# Patient Record
Sex: Male | Born: 1985 | Race: White | Hispanic: No | Marital: Single | Smoking: Current every day smoker
Health system: Southern US, Community
[De-identification: ages and names within clinical notes are randomized; demographics above are authoritative.]

## PROBLEM LIST (undated history)

## (undated) DIAGNOSIS — S069XAA Unspecified intracranial injury with loss of consciousness status unknown, initial encounter: Secondary | ICD-10-CM

## (undated) DIAGNOSIS — F988 Other specified behavioral and emotional disorders with onset usually occurring in childhood and adolescence: Secondary | ICD-10-CM

## (undated) DIAGNOSIS — S069X9A Unspecified intracranial injury with loss of consciousness of unspecified duration, initial encounter: Secondary | ICD-10-CM

## (undated) HISTORY — PX: APPENDECTOMY: SHX54

## (undated) HISTORY — PX: SKIN GRAFT: SHX250

---
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
## 2008-07-09 ENCOUNTER — Emergency Department: Payer: Self-pay | Admitting: Internal Medicine

## 2009-09-14 ENCOUNTER — Inpatient Hospital Stay: Payer: Self-pay | Admitting: Surgery

## 2009-09-14 ENCOUNTER — Ambulatory Visit: Payer: Self-pay | Admitting: Cardiothoracic Surgery

## 2009-10-06 ENCOUNTER — Inpatient Hospital Stay: Payer: Self-pay | Admitting: Surgery

## 2010-12-01 ENCOUNTER — Emergency Department (HOSPITAL_COMMUNITY)
Admission: EM | Admit: 2010-12-01 | Discharge: 2010-12-02 | Disposition: A | Discharge status: Home / Self Care | Payer: BC Managed Care – PPO | Attending: Emergency Medicine | Admitting: Emergency Medicine

## 2010-12-01 DIAGNOSIS — J3489 Other specified disorders of nose and nasal sinuses: Secondary | ICD-10-CM | POA: Insufficient documentation

## 2010-12-01 DIAGNOSIS — F411 Generalized anxiety disorder: Secondary | ICD-10-CM | POA: Insufficient documentation

## 2010-12-01 DIAGNOSIS — IMO0001 Reserved for inherently not codable concepts without codable children: Secondary | ICD-10-CM | POA: Insufficient documentation

## 2010-12-01 DIAGNOSIS — F17200 Nicotine dependence, unspecified, uncomplicated: Secondary | ICD-10-CM | POA: Insufficient documentation

## 2010-12-01 DIAGNOSIS — R059 Cough, unspecified: Secondary | ICD-10-CM | POA: Insufficient documentation

## 2010-12-01 DIAGNOSIS — J069 Acute upper respiratory infection, unspecified: Secondary | ICD-10-CM | POA: Insufficient documentation

## 2010-12-01 DIAGNOSIS — R05 Cough: Secondary | ICD-10-CM | POA: Insufficient documentation

## 2010-12-01 DIAGNOSIS — R062 Wheezing: Secondary | ICD-10-CM | POA: Insufficient documentation

## 2010-12-02 ENCOUNTER — Emergency Department (HOSPITAL_COMMUNITY): Payer: BC Managed Care – PPO

## 2011-01-23 IMAGING — CR DG CHEST 1V PORT
1 series · 1 of 1 positions shown · non-contrast
Comparison: none

REASON FOR EXAM: left pneumothorax
COMMENTS:

PROCEDURE:     DXR - DXR PORTABLE CHEST SINGLE VIEW  - October 09, 2009  [DATE]
RESULT:     Comparison: 10/07/2009 , 10/08/2009

[view not recorded]
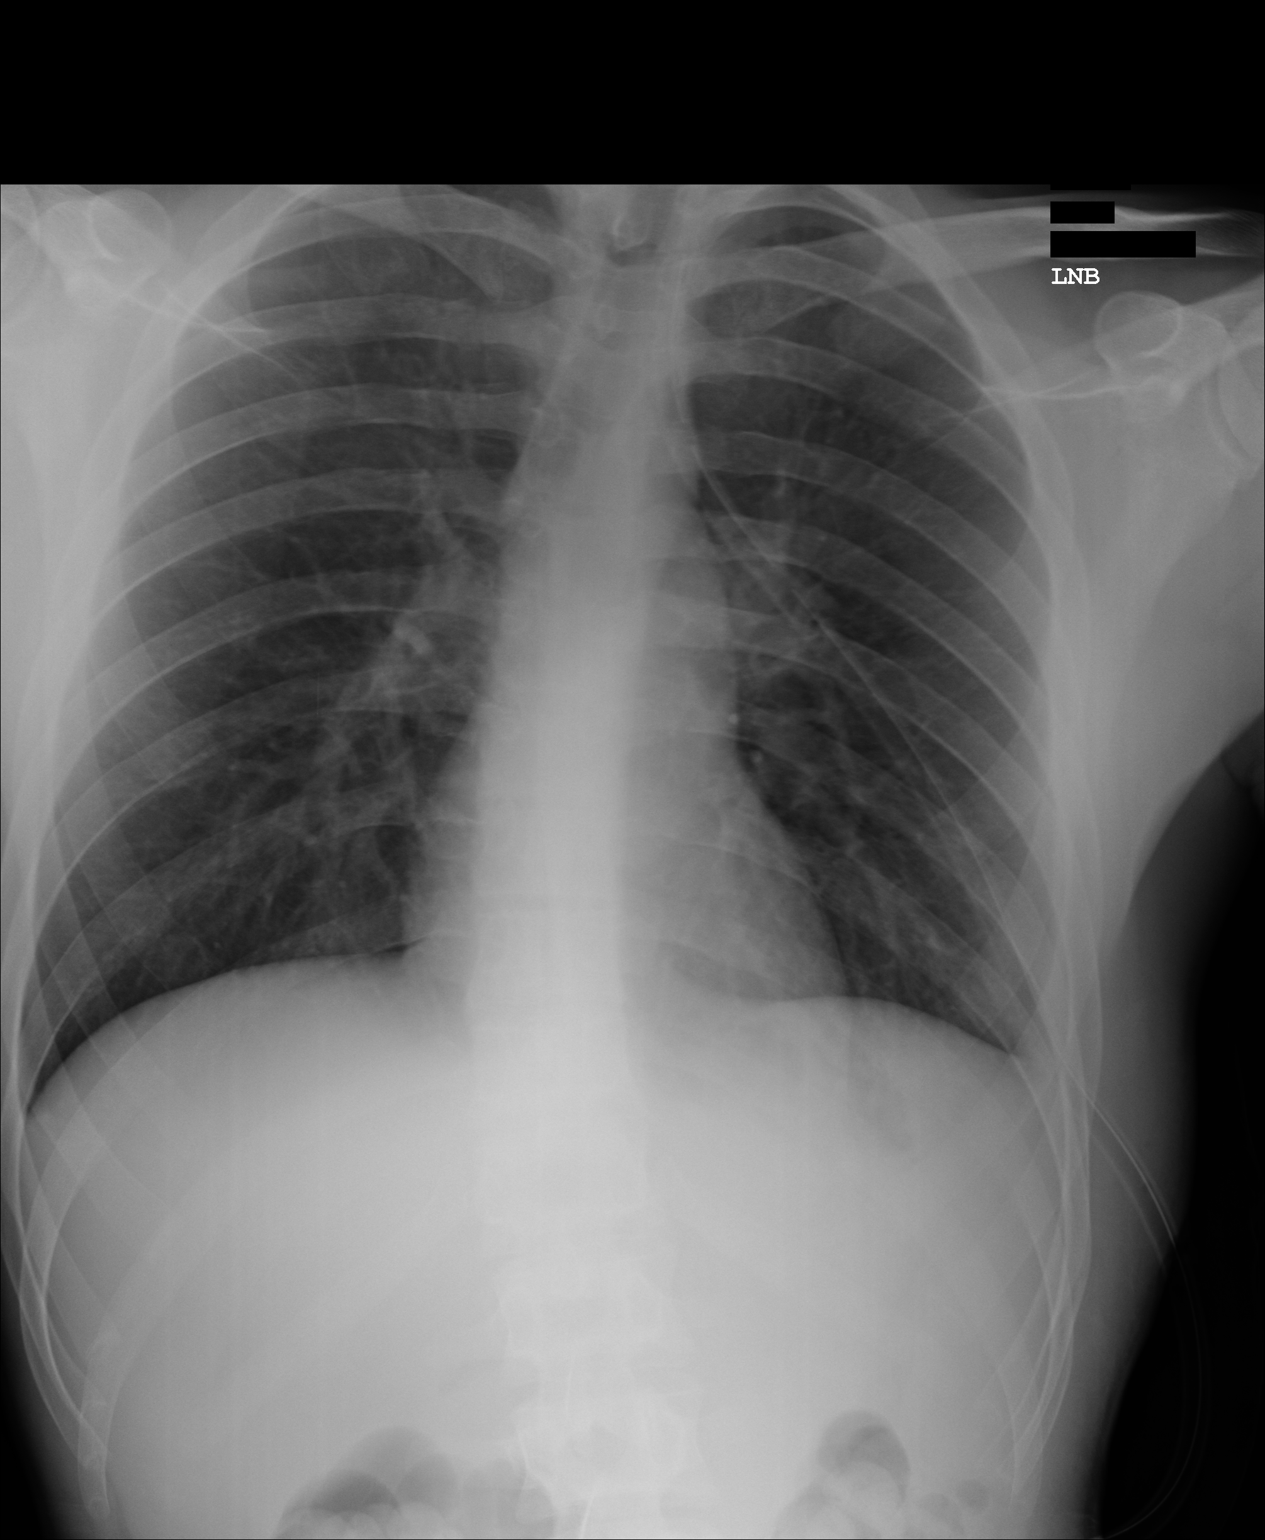

[1 of 1 positions shown; findings below may reference images not displayed]

FINDINGS: Single portable AP chest radiograph is provided. There is a left-sided chest
tube in unchanged position. There is no left pneumothorax. There is no focal
consolidation or pleural effusion. Normal cardiomediastinal silhouette. The
osseous structures are unremarkable.
IMPRESSION: Left-sided chest tube without evidence of a pneumothorax.

## 2011-04-27 ENCOUNTER — Emergency Department: Payer: Self-pay | Admitting: Emergency Medicine

## 2011-05-03 ENCOUNTER — Emergency Department: Payer: Self-pay | Admitting: Emergency Medicine

## 2011-10-09 ENCOUNTER — Emergency Department: Payer: Self-pay | Admitting: Emergency Medicine

## 2011-10-09 LAB — CBC
HCT: 41.9 % (ref 40.0–52.0)
HGB: 14.6 g/dL (ref 13.0–18.0)
MCH: 28.9 pg (ref 26.0–34.0)
MCHC: 34.9 g/dL (ref 32.0–36.0)
MCV: 83 fL (ref 80–100)
Platelet: 190 10*3/uL (ref 150–440)
RDW: 16 % — ABNORMAL HIGH (ref 11.5–14.5)

## 2011-10-09 LAB — COMPREHENSIVE METABOLIC PANEL
Alkaline Phosphatase: 127 U/L (ref 50–136)
Anion Gap: 9 (ref 7–16)
Bilirubin,Total: 0.4 mg/dL (ref 0.2–1.0)
Calcium, Total: 9.3 mg/dL (ref 8.5–10.1)
Co2: 24 mmol/L (ref 21–32)
EGFR (African American): >60
EGFR (Non-African Amer.): >60
Osmolality: 281 (ref 275–301)
SGPT (ALT): 27 U/L (ref 12–78)
Sodium: 141 mmol/L (ref 136–145)

## 2011-10-09 LAB — URINALYSIS, COMPLETE
Bacteria: NONE SEEN
Bilirubin,UR: NEGATIVE
Blood: NEGATIVE
Glucose,UR: NEGATIVE mg/dL (ref 0–75)
Leukocyte Esterase: NEGATIVE
RBC,UR: <1 /HPF (ref 0–5)
Squamous Epithelial: NONE SEEN
WBC UR: NONE SEEN /HPF (ref 0–5)

## 2011-10-09 LAB — DRUG SCREEN, URINE
Amphetamines, Ur Screen: NEGATIVE (ref ?–1000)
Barbiturates, Ur Screen: NEGATIVE (ref ?–200)
Cocaine Metabolite,Ur ~~LOC~~: NEGATIVE (ref ?–300)
Opiate, Ur Screen: NEGATIVE (ref ?–300)
Tricyclic, Ur Screen: NEGATIVE (ref ?–1000)

## 2011-10-09 LAB — SALICYLATE LEVEL: Salicylates, Serum: <1.7 mg/dL

## 2011-10-09 LAB — ETHANOL: Ethanol: 176 mg/dL

## 2013-03-14 ENCOUNTER — Ambulatory Visit: Payer: Self-pay | Admitting: Otolaryngology

## 2013-10-08 ENCOUNTER — Emergency Department: Payer: Self-pay | Admitting: Emergency Medicine

## 2013-10-09 LAB — COMPREHENSIVE METABOLIC PANEL
Albumin: 3.8 g/dL (ref 3.4–5.0)
Alkaline Phosphatase: 108 U/L
Anion Gap: 8 (ref 7–16)
BILIRUBIN TOTAL: 0.2 mg/dL (ref 0.2–1.0)
BUN: 10 mg/dL (ref 7–18)
CALCIUM: 8.8 mg/dL (ref 8.5–10.1)
CREATININE: 0.95 mg/dL (ref 0.60–1.30)
Chloride: 107 mmol/L (ref 98–107)
Co2: 26 mmol/L (ref 21–32)
EGFR (Non-African Amer.): >60
GLUCOSE: 92 mg/dL (ref 65–99)
OSMOLALITY: 280 (ref 275–301)
Potassium: 3.7 mmol/L (ref 3.5–5.1)
SGOT(AST): 26 U/L (ref 15–37)
SGPT (ALT): 32 U/L
Sodium: 141 mmol/L (ref 136–145)
TOTAL PROTEIN: 7.3 g/dL (ref 6.4–8.2)

## 2013-10-09 LAB — CBC
HCT: 43.8 % (ref 40.0–52.0)
HGB: 14.6 g/dL (ref 13.0–18.0)
MCH: 29.6 pg (ref 26.0–34.0)
MCHC: 33.3 g/dL (ref 32.0–36.0)
MCV: 89 fL (ref 80–100)
PLATELETS: 131 10*3/uL — AB (ref 150–440)
RBC: 4.92 10*6/uL (ref 4.40–5.90)
RDW: 13.6 % (ref 11.5–14.5)
WBC: 5.1 10*3/uL (ref 3.8–10.6)

## 2013-10-09 LAB — URINALYSIS, COMPLETE
BILIRUBIN, UR: NEGATIVE
GLUCOSE, UR: NEGATIVE mg/dL (ref 0–75)
Ketone: NEGATIVE
LEUKOCYTE ESTERASE: NEGATIVE
Nitrite: NEGATIVE
PROTEIN: NEGATIVE
Ph: 6 (ref 4.5–8.0)
Specific Gravity: 1.014 (ref 1.003–1.030)

## 2013-10-09 LAB — GC/CHLAMYDIA PROBE AMP

## 2015-10-27 ENCOUNTER — Encounter: Payer: Self-pay | Admitting: Emergency Medicine

## 2015-10-27 ENCOUNTER — Emergency Department
Admission: EM | Admit: 2015-10-27 | Discharge: 2015-10-28 | Disposition: A | Discharge status: Home / Self Care | Payer: Self-pay | Attending: Emergency Medicine | Admitting: Emergency Medicine

## 2015-10-27 DIAGNOSIS — R1084 Generalized abdominal pain: Secondary | ICD-10-CM

## 2015-10-27 DIAGNOSIS — K219 Gastro-esophageal reflux disease without esophagitis: Secondary | ICD-10-CM

## 2015-10-27 DIAGNOSIS — Z0000 Encounter for general adult medical examination without abnormal findings: Secondary | ICD-10-CM | POA: Insufficient documentation

## 2015-10-27 DIAGNOSIS — Z87891 Personal history of nicotine dependence: Secondary | ICD-10-CM | POA: Insufficient documentation

## 2015-10-27 LAB — URINALYSIS COMPLETE WITH MICROSCOPIC (ARMC ONLY)
BACTERIA UA: NONE SEEN
Bilirubin Urine: NEGATIVE
Glucose, UA: NEGATIVE mg/dL
HGB URINE DIPSTICK: NEGATIVE
Ketones, ur: NEGATIVE mg/dL
LEUKOCYTES UA: NEGATIVE
Nitrite: NEGATIVE
PH: 5 (ref 5.0–8.0)
Protein, ur: NEGATIVE mg/dL
SQUAMOUS EPITHELIAL / LPF: NONE SEEN
Specific Gravity, Urine: 1.018 (ref 1.005–1.030)

## 2015-10-27 LAB — CBC
HEMATOCRIT: 45.3 % (ref 40.0–52.0)
Hemoglobin: 15.6 g/dL (ref 13.0–18.0)
MCH: 29.9 pg (ref 26.0–34.0)
MCHC: 34.3 g/dL (ref 32.0–36.0)
MCV: 87.3 fL (ref 80.0–100.0)
Platelets: 205 10*3/uL (ref 150–440)
RBC: 5.19 MIL/uL (ref 4.40–5.90)
RDW: 13.6 % (ref 11.5–14.5)
WBC: 6.8 10*3/uL (ref 3.8–10.6)

## 2015-10-27 LAB — COMPREHENSIVE METABOLIC PANEL
ALT: 25 U/L (ref 17–63)
AST: 24 U/L (ref 15–41)
Albumin: 4.9 g/dL (ref 3.5–5.0)
Alkaline Phosphatase: 94 U/L (ref 38–126)
Anion gap: 8 (ref 5–15)
BUN: 13 mg/dL (ref 6–20)
CHLORIDE: 105 mmol/L (ref 101–111)
CO2: 28 mmol/L (ref 22–32)
Calcium: 9.4 mg/dL (ref 8.9–10.3)
Creatinine, Ser: 1 mg/dL (ref 0.61–1.24)
Glucose, Bld: 87 mg/dL (ref 65–99)
POTASSIUM: 3.8 mmol/L (ref 3.5–5.1)
Sodium: 141 mmol/L (ref 135–145)
Total Bilirubin: 0.5 mg/dL (ref 0.3–1.2)
Total Protein: 8 g/dL (ref 6.5–8.1)

## 2015-10-27 LAB — LIPASE, BLOOD: LIPASE: 32 U/L (ref 11–51)

## 2015-10-27 NOTE — ED Notes (Signed)
Pt reports burning with urination x1 week, right abdominal pain x3 weeks.

## 2015-10-27 NOTE — ED Triage Notes (Signed)
Patient ambulatory to triage with steady gait, without difficulty or distress noted; pt reports since last night having lower abd pain with no accomp symptoms

## 2015-10-28 LAB — CHLAMYDIA/NGC RT PCR (ARMC ONLY)
CHLAMYDIA TR: NOT DETECTED
N gonorrhoeae: NOT DETECTED

## 2015-10-28 MED ORDER — FAMOTIDINE 20 MG PO TABS: 20.0000 mg | ORAL_TABLET | Freq: Two times a day (BID) | ORAL | 1 refills | Status: DC

## 2015-10-28 NOTE — ED Provider Notes (Signed)
[facility] Emergency Department Provider Note        Time seen: ----------------------------------------- 12:12 AM on 10/28/2015 -----------------------------------------    I have reviewed the triage vital signs and the nursing notes.   HISTORY  Chief Complaint Abdominal Pain    HPI Anthony Snow is a 30 y.o. male who presents to ER with reports of lower abdominal pain with no other associated symptoms. Patient states he was making out with a girl and she was having the same symptoms that he had. There is nothing the same restaurant, he was also concerned may have an STD. He denies any dysuria or discharge from his penis. Patient described a funny feeling in his mouth was concerned he may have herpes.   History reviewed. No pertinent past medical history.  There are no active problems to display for this patient.   Past Surgical History:  Procedure Laterality Date  . APPENDECTOMY      Allergies Review of patient's allergies indicates no known allergies.  Social History Social History  Substance Use Topics  . Smoking status: Former Games developer  . Smokeless tobacco: Never Used  . Alcohol use No    Review of Systems Constitutional: Negative for fever. Cardiovascular: Negative for chest pain. Respiratory: Negative for shortness of breath. Gastrointestinal: Positive for abdominal pain Genitourinary: Negative for dysuria. Musculoskeletal: Negative for back pain. Skin: Negative for rash. Neurological: Negative for headaches, focal weakness or numbness.  10-point ROS otherwise negative.  ____________________________________________   PHYSICAL EXAM:  VITAL SIGNS: ED Triage Vitals  Enc Vitals Group     BP 10/27/15 2056 133/69     Pulse Rate 10/27/15 2056 75     Resp 10/27/15 2056 18     Temp 10/27/15 2056 97.5 F (36.4 C)     Temp Source 10/27/15 2056 Oral     SpO2 10/27/15 2056 100 %     Weight 10/27/15 2057 140 lb (63.5 kg)      Height 10/27/15 2057 6\' 3"  (1.905 m)     Head Circumference --      Peak Flow --      Pain Score 10/27/15 2055 3     Pain Loc --      Pain Edu? --      Excl. in GC? --     Constitutional: Alert and oriented. Well appearing and in no distress.Anxious Eyes: Conjunctivae are normal. PERRL. Normal extraocular movements. ENT   Head: Normocephalic and atraumatic.   Nose: No congestion/rhinnorhea.   Mouth/Throat: Mucous membranes are moist.No intraoral lesions are noted   Neck: No stridor. Cardiovascular: Normal rate, regular rhythm. No murmurs, rubs, or gallops. Respiratory: Normal respiratory effort without tachypnea nor retractions. Breath sounds are clear and equal bilaterally. No wheezes/rales/rhonchi. Gastrointestinal: Soft and nontender. Normal bowel sounds Musculoskeletal: Nontender with normal range of motion in all extremities. No lower extremity tenderness nor edema. Neurologic:  Normal speech and language. No gross focal neurologic deficits are appreciated.  Skin:  Skin is warm, dry and intact. No rash noted. Psychiatric: Mood and affect are normal. Speech and behavior are normal.  ____________________________________________  ED COURSE:  Pertinent labs & imaging results that were available during my care of the patient were reviewed by me and considered in my medical decision making (see chart for details). Clinical Course  Patient is in no distress, we will assess with basic labs and imaging if necessary  Procedures ____________________________________________   LABS (pertinent positives/negatives)  Labs Reviewed  URINALYSIS COMPLETEWITH MICROSCOPIC ([facility] ONLY) -  Abnormal; Notable for the following:       Result Value   Color, Urine YELLOW (*)    APPearance CLEAR (*)    All other components within normal limits  CHLAMYDIA/NGC RT PCR ([facility] ONLY)  LIPASE, BLOOD  COMPREHENSIVE METABOLIC PANEL  CBC    ____________________________________________  FINAL ASSESSMENT AND PLAN  Abdominal pain, medical screening exam  Plan: Patient with labs as dictated above. Patient is in no distress, I have advised him we will add a GC and chlamydia test to his urine and contact him if it is positive. He does describe reflux symptoms from time to time, I will start him on Pepcid. He is stable for outpatient follow-up.   Emily FilbertWilliams, Chrystie Hagwood E, MD   Note: This dictation was prepared with Dragon dictation. Any transcriptional errors that result from this process are unintentional    Emily FilbertJonathan E Victoria Euceda, MD 10/28/15 [phone]

## 2015-10-28 NOTE — ED Notes (Signed)
Called lab to let know about add on urine order.

## 2017-05-26 ENCOUNTER — Emergency Department: Payer: Self-pay

## 2017-05-26 ENCOUNTER — Emergency Department
Admission: EM | Admit: 2017-05-26 | Discharge: 2017-05-27 | Disposition: A | Discharge status: Home / Self Care | Payer: Self-pay | Attending: Student in an Organized Health Care Education/Training Program | Admitting: Student in an Organized Health Care Education/Training Program

## 2017-05-26 DIAGNOSIS — R079 Chest pain, unspecified: Secondary | ICD-10-CM

## 2017-05-26 DIAGNOSIS — Z79899 Other long term (current) drug therapy: Secondary | ICD-10-CM | POA: Insufficient documentation

## 2017-05-26 DIAGNOSIS — F439 Reaction to severe stress, unspecified: Secondary | ICD-10-CM | POA: Insufficient documentation

## 2017-05-26 DIAGNOSIS — Z87891 Personal history of nicotine dependence: Secondary | ICD-10-CM | POA: Insufficient documentation

## 2017-05-26 HISTORY — DX: Unspecified intracranial injury with loss of consciousness status unknown, initial encounter: S06.9XAA

## 2017-05-26 HISTORY — DX: Unspecified intracranial injury with loss of consciousness of unspecified duration, initial encounter: S06.9X9A

## 2017-05-26 LAB — CBC
HEMATOCRIT: 48.2 % (ref 40.0–52.0)
HEMOGLOBIN: 17 g/dL (ref 13.0–18.0)
MCH: 30.3 pg (ref 26.0–34.0)
MCHC: 35.3 g/dL (ref 32.0–36.0)
MCV: 85.9 fL (ref 80.0–100.0)
Platelets: 260 10*3/uL (ref 150–440)
RBC: 5.61 MIL/uL (ref 4.40–5.90)
RDW: 13.6 % (ref 11.5–14.5)
WBC: 9.2 10*3/uL (ref 3.8–10.6)

## 2017-05-26 LAB — BASIC METABOLIC PANEL
Anion gap: 8 (ref 5–15)
BUN: 12 mg/dL (ref 6–20)
CHLORIDE: 103 mmol/L (ref 101–111)
CO2: 29 mmol/L (ref 22–32)
Calcium: 9.4 mg/dL (ref 8.9–10.3)
Creatinine, Ser: 0.88 mg/dL (ref 0.61–1.24)
GFR calc Af Amer: >60 mL/min (ref >60)
GFR calc non Af Amer: >60 mL/min (ref >60)
Glucose, Bld: 95 mg/dL (ref 65–99)
POTASSIUM: 3.7 mmol/L (ref 3.5–5.1)
SODIUM: 140 mmol/L (ref 135–145)

## 2017-05-26 LAB — LIPASE, BLOOD: Lipase: 32 U/L (ref 11–51)

## 2017-05-26 LAB — TROPONIN I: Troponin I: <0.03 ng/mL (ref <0.03)

## 2017-05-26 MED ORDER — LORAZEPAM 1 MG PO TABS
1.0000 mg | ORAL_TABLET | Freq: Once | ORAL | Status: AC
  Administered 2017-05-26: 1 mg via ORAL
  Filled 2017-05-26: qty 1

## 2017-05-26 NOTE — ED Triage Notes (Signed)
Pt reports that he has been having intermittent chest and stomach pain for 2 weeks.  Pt reports that he had this in [place] and went to the hospital, but had a panic attack and left.  Pt denies drug use.

## 2017-05-26 NOTE — ED Provider Notes (Signed)
[facility] Emergency Department Provider Note    First MD Initiated Contact with Patient 05/26/17 2304     (approximate)  I have reviewed the triage vital signs and the nursing notes.   HISTORY  Chief Complaint Chest Pain and Abdominal Pain    HPI Su MonksRobert J Chea is a 32 y.o. male presents with 2 weeks of intermittent chest pain and stomach pain associated with shortness of breath.  States the pain is frequently worsened by stress and anxiety.  Patient states he has been stressed out not being able to see his kids and with recent travel as well as stresses at work.  Denies any fevers.  No cough.  States that the epigastric discomfort is gnawing and aching.  Denies any pain or nausea at this time.  States he has had decreased oral intake.  Past Medical History:  Diagnosis Date  . 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 Past Surgical History:  Procedure Laterality Date  . APPENDECTOMY     There are no active problems to display for this patient.     Prior to Admission medications   Medication Sig Start Date End Date Taking? Authorizing Provider  famotidine (PEPCID) 20 MG tablet Take 1 tablet (20 mg total) by mouth 2 (two) times daily. 10/28/15   Emily FilbertWilliams, Jonathan E, MD    Allergies Patient has no known allergies.    Social History Social History   Tobacco Use  . Smoking status: Former Games developermoker  . Smokeless tobacco: Never Used  Substance Use Topics  . Alcohol use: No  . Drug use: Yes    Types: Marijuana    Review of Systems Patient denies headaches, rhinorrhea, blurry vision, numbness, shortness of breath, chest pain, edema, cough, abdominal pain, nausea, vomiting, diarrhea, dysuria, fevers, rashes or hallucinations unless otherwise stated above in HPI. ____________________________________________   PHYSICAL EXAM:  VITAL SIGNS: Vitals:   05/26/17 2249  Temp: 98.1 F (36.7 C)    Constitutional: Alert and  oriented. anxious appearing and in no acute distress. Eyes: Conjunctivae are normal.  Head: Atraumatic. Nose: No congestion/rhinnorhea. Mouth/Throat: Mucous membranes are moist.   Neck: No stridor. Painless ROM.  Cardiovascular: Normal rate, regular rhythm. Grossly normal heart sounds.  Good peripheral circulation. Respiratory: Normal respiratory effort.  No retractions. Lungs CTAB. Gastrointestinal: Soft and nontender. No distention. No abdominal bruits. No CVA tenderness. Genitourinary:  Musculoskeletal: No lower extremity tenderness nor edema.  No joint effusions. Neurologic:  Normal speech and language. No gross focal neurologic deficits are appreciated. No facial droop Skin:  Skin is warm, dry and intact. No rash noted. Psychiatric: very anxious appearing, No SI or HI ____________________________________________   LABS (all labs ordered are listed, but only abnormal results are displayed)  Results for orders placed or performed during the hospital encounter of 05/26/17 (from the past 24 hour(s))  Basic metabolic panel     Status: None   Collection Time: 05/26/17 10:52 PM  Result Value Ref Range   Sodium 140 135 - 145 mmol/L   Potassium 3.7 3.5 - 5.1 mmol/L   Chloride 103 101 - 111 mmol/L   CO2 29 22 - 32 mmol/L   Glucose, Bld 95 65 - 99 mg/dL   BUN 12 6 - 20 mg/dL   Creatinine, Ser 1.610.88 0.61 - 1.24 mg/dL   Calcium 9.4 8.9 - 09.610.3 mg/dL   GFR calc non Af Amer >60 >60 mL/min   GFR calc Af Amer >60 >60  mL/min   Anion gap 8 5 - 15  CBC     Status: None   Collection Time: 05/26/17 10:52 PM  Result Value Ref Range   WBC 9.2 3.8 - 10.6 K/uL   RBC 5.61 4.40 - 5.90 MIL/uL   Hemoglobin 17.0 13.0 - 18.0 g/dL   HCT 40.9 81.1 - 91.4 %   MCV 85.9 80.0 - 100.0 fL   MCH 30.3 26.0 - 34.0 pg   MCHC 35.3 32.0 - 36.0 g/dL   RDW 78.2 95.6 - 21.3 %   Platelets 260 150 - 440 K/uL  Troponin I     Status: None   Collection Time: 05/26/17 10:52 PM  Result Value Ref Range   Troponin I <0.03  <0.03 ng/mL  Lipase, blood     Status: None   Collection Time: 05/26/17 10:52 PM  Result Value Ref Range   Lipase 32 11 - 51 U/L   ____________________________________________  EKG My review and personal interpretation at Time: 22:56   Indication: chest pain  Rate: 99  Rhythm: sinus Axis: normal Other: normal intervals, no stemi, no brugada ____________________________________________  RADIOLOGY  I personally reviewed all radiographic images ordered to evaluate for the above acute complaints and reviewed radiology reports and findings.  These findings were personally discussed with the patient.  Please see medical record for radiology report.  ____________________________________________   PROCEDURES  Procedure(s) performed:  Procedures    Critical Care performed: no ____________________________________________   INITIAL IMPRESSION / ASSESSMENT AND PLAN / ED COURSE  Pertinent labs & imaging results that were available during my care of the patient were reviewed by me and considered in my medical decision making (see chart for details).  DDX: Anxiety, gastritis, ACS, dissection, PE, pneumonia  KLAYTON MONIE is a 32 y.o. who presents to the ED with symptoms as described above.  Patient well-appearing but is very anxious.  No respiratory distress.  Vital signs are stable.  Low risk heart score of 2.  Is not clinically consistent with ACS.  Not clinically consistent with dissection.  He is low risk by Wells criteria and is PERC negative.  Most clinically consistent with anxiety and stress.  Will give ativan and reassess.    Reassessed with significant improvement in symptoms.  Do suspect there is large component of stress therefore will give a small prescription of Valium to be used as needed on the patient is arranging follow-up with PCP and [facility].  Have discussed with the patient and available family all diagnostics and treatments performed thus far and all questions were  answered to the best of my ability. The patient demonstrates understanding and agreement with plan.   As part of my medical decision making, I reviewed the following data within the electronic MEDICAL RECORD NUMBER Nursing notes reviewed and incorporated, Labs reviewed, notes from prior ED visits and [place] Controlled Substance Database   ____________________________________________   FINAL CLINICAL IMPRESSION(S) / ED DIAGNOSES  Final diagnoses:  Chest pain, unspecified type  Stress      NEW MEDICATIONS STARTED DURING THIS VISIT:  New Prescriptions   No medications on file     Note:  This document was prepared using Dragon voice recognition software and may include unintentional dictation errors.    Willy Eddy, MD 05/27/17 [phone]

## 2017-05-26 NOTE — ED Notes (Signed)
ED Provider at bedside. 

## 2017-05-27 MED ORDER — DIAZEPAM 5 MG PO TABS: 5.0000 mg | ORAL_TABLET | Freq: Two times a day (BID) | ORAL | 0 refills | Status: AC | PRN

## 2017-05-27 MED ORDER — LORAZEPAM 1 MG PO TABS
1.0000 mg | ORAL_TABLET | Freq: Once | ORAL | Status: AC
  Administered 2017-05-27: 1 mg via ORAL

## 2017-05-27 MED ORDER — LORAZEPAM 1 MG PO TABS
ORAL_TABLET | ORAL | Status: AC
  Filled 2017-05-27: qty 1

## 2017-05-27 NOTE — ED Notes (Signed)
Reviewed discharge instructions, follow-up care, and prescriptions with patient. Patient verbalized understanding of all information reviewed. Patient stable, with no distress noted at this time.    

## 2017-05-27 NOTE — ED Notes (Signed)
ED Provider at bedside. 

## 2017-11-21 ENCOUNTER — Emergency Department: Payer: Medicaid Other

## 2017-11-21 ENCOUNTER — Encounter: Payer: Self-pay | Admitting: Emergency Medicine

## 2017-11-21 ENCOUNTER — Emergency Department
Admission: EM | Admit: 2017-11-21 | Discharge: 2017-11-21 | Disposition: A | Discharge status: Home / Self Care | Payer: Medicaid Other | Attending: Emergency Medicine | Admitting: Emergency Medicine

## 2017-11-21 DIAGNOSIS — T07XXXA Unspecified multiple injuries, initial encounter: Secondary | ICD-10-CM

## 2017-11-21 DIAGNOSIS — Y939 Activity, unspecified: Secondary | ICD-10-CM | POA: Insufficient documentation

## 2017-11-21 DIAGNOSIS — F10920 Alcohol use, unspecified with intoxication, uncomplicated: Secondary | ICD-10-CM

## 2017-11-21 DIAGNOSIS — F10129 Alcohol abuse with intoxication, unspecified: Secondary | ICD-10-CM | POA: Insufficient documentation

## 2017-11-21 DIAGNOSIS — T148XXA Other injury of unspecified body region, initial encounter: Secondary | ICD-10-CM | POA: Insufficient documentation

## 2017-11-21 DIAGNOSIS — Y9289 Other specified places as the place of occurrence of the external cause: Secondary | ICD-10-CM | POA: Insufficient documentation

## 2017-11-21 DIAGNOSIS — T7611XA Adult physical abuse, suspected, initial encounter: Secondary | ICD-10-CM | POA: Insufficient documentation

## 2017-11-21 DIAGNOSIS — Y999 Unspecified external cause status: Secondary | ICD-10-CM | POA: Insufficient documentation

## 2017-11-21 DIAGNOSIS — Z79899 Other long term (current) drug therapy: Secondary | ICD-10-CM | POA: Insufficient documentation

## 2017-11-21 DIAGNOSIS — Z87891 Personal history of nicotine dependence: Secondary | ICD-10-CM | POA: Insufficient documentation

## 2017-11-21 LAB — URINE DRUG SCREEN, QUALITATIVE (ARMC ONLY)
Amphetamines, Ur Screen: NOT DETECTED
BARBITURATES, UR SCREEN: NOT DETECTED
BENZODIAZEPINE, UR SCRN: NOT DETECTED
CANNABINOID 50 NG, UR ~~LOC~~: NOT DETECTED
Cocaine Metabolite,Ur ~~LOC~~: NOT DETECTED
MDMA (Ecstasy)Ur Screen: NOT DETECTED
METHADONE SCREEN, URINE: NOT DETECTED
OPIATE, UR SCREEN: NOT DETECTED
PHENCYCLIDINE (PCP) UR S: NOT DETECTED
Tricyclic, Ur Screen: NOT DETECTED

## 2017-11-21 LAB — CBC WITH DIFFERENTIAL/PLATELET
Basophils Absolute: 0.1 10*3/uL (ref 0–0.1)
Basophils Relative: 1 %
EOS PCT: 1 %
Eosinophils Absolute: 0.1 10*3/uL (ref 0–0.7)
HEMATOCRIT: 43.5 % (ref 40.0–52.0)
Hemoglobin: 14.9 g/dL (ref 13.0–18.0)
LYMPHS PCT: 34 %
Lymphs Abs: 3 10*3/uL (ref 1.0–3.6)
MCH: 30 pg (ref 26.0–34.0)
MCHC: 34.3 g/dL (ref 32.0–36.0)
MCV: 87.5 fL (ref 80.0–100.0)
MONO ABS: 0.9 10*3/uL (ref 0.2–1.0)
Monocytes Relative: 10 %
NEUTROS ABS: 4.8 10*3/uL (ref 1.4–6.5)
Neutrophils Relative %: 54 %
PLATELETS: 234 10*3/uL (ref 150–440)
RBC: 4.97 MIL/uL (ref 4.40–5.90)
RDW: 13.7 % (ref 11.5–14.5)
WBC: 8.8 10*3/uL (ref 3.8–10.6)

## 2017-11-21 LAB — COMPREHENSIVE METABOLIC PANEL
ALT: 20 U/L (ref 0–44)
AST: 40 U/L (ref 15–41)
Albumin: 4.8 g/dL (ref 3.5–5.0)
Alkaline Phosphatase: 93 U/L (ref 38–126)
Anion gap: 7 (ref 5–15)
BILIRUBIN TOTAL: 0.4 mg/dL (ref 0.3–1.2)
BUN: 8 mg/dL (ref 6–20)
CALCIUM: 8.5 mg/dL — AB (ref 8.9–10.3)
CHLORIDE: 107 mmol/L (ref 98–111)
CO2: 25 mmol/L (ref 22–32)
CREATININE: 1.01 mg/dL (ref 0.61–1.24)
GFR calc non Af Amer: >60 mL/min (ref >60)
GLUCOSE: 104 mg/dL — AB (ref 70–99)
Potassium: 3.6 mmol/L (ref 3.5–5.1)
Sodium: 139 mmol/L (ref 135–145)
TOTAL PROTEIN: 7.5 g/dL (ref 6.5–8.1)

## 2017-11-21 LAB — ETHANOL: Alcohol, Ethyl (B): 261 mg/dL — ABNORMAL HIGH (ref <10)

## 2017-11-21 MED ORDER — LORAZEPAM 2 MG/ML IJ SOLN
1.0000 mg | Freq: Once | INTRAMUSCULAR | Status: AC
  Administered 2017-11-21: 1 mg via INTRAVENOUS

## 2017-11-21 MED ORDER — LORAZEPAM 2 MG/ML IJ SOLN
INTRAMUSCULAR | Status: AC
  Administered 2017-11-21: 1 mg via INTRAVENOUS
  Filled 2017-11-21: qty 1

## 2017-11-21 MED ORDER — HALOPERIDOL LACTATE 5 MG/ML IJ SOLN
INTRAMUSCULAR | Status: AC
  Filled 2017-11-21: qty 1

## 2017-11-21 MED ORDER — SODIUM CHLORIDE 0.9 % IV BOLUS
1000.0000 mL | Freq: Once | INTRAVENOUS | Status: AC
  Administered 2017-11-21: 1000 mL via INTRAVENOUS

## 2017-11-21 MED ORDER — HALOPERIDOL LACTATE 5 MG/ML IJ SOLN
5.0000 mg | Freq: Once | INTRAMUSCULAR | Status: AC
  Administered 2017-11-21: 5 mg via INTRAVENOUS
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
## 2017-11-21 NOTE — Discharge Instructions (Signed)
1.  Drink alcohol only in moderation. °2.  Return to the ER for worsening symptoms, persistent vomiting, lethargy or other concerns. °
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
## 2017-12-06 ENCOUNTER — Encounter: Payer: Self-pay | Admitting: Emergency Medicine

## 2017-12-06 ENCOUNTER — Other Ambulatory Visit: Payer: Self-pay

## 2017-12-06 ENCOUNTER — Emergency Department
Admission: EM | Admit: 2017-12-06 | Discharge: 2017-12-06 | Disposition: A | Discharge status: Home / Self Care | Payer: Medicaid Other | Attending: Emergency Medicine | Admitting: Emergency Medicine

## 2017-12-06 DIAGNOSIS — F439 Reaction to severe stress, unspecified: Secondary | ICD-10-CM

## 2017-12-06 DIAGNOSIS — F10229 Alcohol dependence with intoxication, unspecified: Secondary | ICD-10-CM | POA: Insufficient documentation

## 2017-12-06 DIAGNOSIS — F430 Acute stress reaction: Secondary | ICD-10-CM | POA: Insufficient documentation

## 2017-12-06 DIAGNOSIS — Z79899 Other long term (current) drug therapy: Secondary | ICD-10-CM | POA: Insufficient documentation

## 2017-12-06 DIAGNOSIS — F919 Conduct disorder, unspecified: Secondary | ICD-10-CM | POA: Insufficient documentation

## 2017-12-06 DIAGNOSIS — Z87891 Personal history of nicotine dependence: Secondary | ICD-10-CM | POA: Insufficient documentation

## 2017-12-06 DIAGNOSIS — F10929 Alcohol use, unspecified with intoxication, unspecified: Secondary | ICD-10-CM

## 2017-12-06 LAB — COMPREHENSIVE METABOLIC PANEL
ALK PHOS: 98 U/L (ref 38–126)
ALT: 36 U/L (ref 0–44)
ANION GAP: 7 (ref 5–15)
AST: 31 U/L (ref 15–41)
Albumin: 5 g/dL (ref 3.5–5.0)
BILIRUBIN TOTAL: 0.6 mg/dL (ref 0.3–1.2)
BUN: 9 mg/dL (ref 6–20)
CALCIUM: 8.7 mg/dL — AB (ref 8.9–10.3)
CO2: 27 mmol/L (ref 22–32)
Chloride: 111 mmol/L (ref 98–111)
Creatinine, Ser: 0.82 mg/dL (ref 0.61–1.24)
GFR calc Af Amer: >60 mL/min (ref >60)
Glucose, Bld: 92 mg/dL (ref 70–99)
POTASSIUM: 4.2 mmol/L (ref 3.5–5.1)
Sodium: 145 mmol/L (ref 135–145)
Total Protein: 8.1 g/dL (ref 6.5–8.1)

## 2017-12-06 LAB — CBC
HEMATOCRIT: 45.5 % (ref 39.0–52.0)
HEMOGLOBIN: 15.3 g/dL (ref 13.0–17.0)
MCH: 29.9 pg (ref 26.0–34.0)
MCHC: 33.6 g/dL (ref 30.0–36.0)
MCV: 88.9 fL (ref 80.0–100.0)
Platelets: 264 10*3/uL (ref 150–400)
RBC: 5.12 MIL/uL (ref 4.22–5.81)
RDW: 13.1 % (ref 11.5–15.5)
WBC: 7.5 10*3/uL (ref 4.0–10.5)
nRBC: 0 % (ref 0.0–0.2)

## 2017-12-06 LAB — URINE DRUG SCREEN, QUALITATIVE (ARMC ONLY)
AMPHETAMINES, UR SCREEN: NOT DETECTED
Barbiturates, Ur Screen: NOT DETECTED
Benzodiazepine, Ur Scrn: NOT DETECTED
CANNABINOID 50 NG, UR ~~LOC~~: NOT DETECTED
Cocaine Metabolite,Ur ~~LOC~~: NOT DETECTED
MDMA (ECSTASY) UR SCREEN: NOT DETECTED
Methadone Scn, Ur: NOT DETECTED
Opiate, Ur Screen: NOT DETECTED
Phencyclidine (PCP) Ur S: NOT DETECTED
TRICYCLIC, UR SCREEN: NOT DETECTED

## 2017-12-06 LAB — ACETAMINOPHEN LEVEL

## 2017-12-06 LAB — ETHANOL: ALCOHOL ETHYL (B): 186 mg/dL — AB (ref <10)

## 2017-12-06 LAB — SALICYLATE LEVEL: Salicylate Lvl: <7 mg/dL (ref 2.8–30.0)

## 2017-12-06 NOTE — ED Provider Notes (Signed)
[facility] Emergency Department Provider Note  ____________________________________________   First MD Initiated Contact with Patient 12/06/17 0424     (approximate)  I have reviewed the triage vital signs and the nursing notes.   HISTORY  Chief Complaint Mental Health Problem   HPI Anthony Snow is a 32 y.o. male who self presents to the emergency department requesting to "speak to someone" after he got into a verbal altercation with his mother earlier today.  He and his mother got into a verbal argument today and his mother called the police out of concern that her son was "too upset".  The patient himself denies suicidal or homicidal ideation.  He says he has never attempted suicide and certainly does not want to hurt himself today.  He said he had a difficult day today and wanted to cool off and talk to someone about it.  His symptoms are currently mild in severity but at their worst were moderate to severe.  They are worsened by interpersonal conflict and improved with rest.  He has never seen a psychiatrist before.    Past Medical History:  Diagnosis Date  . Traumatic brain injury (HCC)     There are no active problems to display for this patient.   Past Surgical History:  Procedure Laterality Date  . APPENDECTOMY      Prior to Admission medications   Medication Sig Start Date End Date Taking? Authorizing Provider  diazepam (VALIUM) 5 MG tablet Take 1 tablet (5 mg total) by mouth every 12 (twelve) hours as needed for anxiety. 05/27/17 05/27/18  Willy Eddy, MD  famotidine (PEPCID) 20 MG tablet Take 1 tablet (20 mg total) by mouth 2 (two) times daily. 10/28/15   Emily Filbert, MD    Allergies Patient has no known allergies.  No family history on file.  Social History Social History   Tobacco Use  . Smoking status: Former Games developer  . Smokeless tobacco: Never Used  Substance Use Topics  . Alcohol use: Yes  . Drug use: Yes      Types: Marijuana    Review of Systems Constitutional: No fever/chills Eyes: No visual changes. ENT: No sore throat. Cardiovascular: Denies chest pain. Respiratory: Denies shortness of breath. Gastrointestinal: No abdominal pain.  No nausea, no vomiting.  No diarrhea.  No constipation. Genitourinary: Negative for dysuria. Musculoskeletal: Negative for back pain. Skin: Negative for rash. Neurological: Negative for headaches, focal weakness or numbness.   ____________________________________________   PHYSICAL EXAM:  VITAL SIGNS: ED Triage Vitals [12/06/17 0351]  Enc Vitals Group     BP 111/84     Pulse Rate 91     Resp      Temp 98.3 F (36.8 C)     Temp Source Oral     SpO2 100 %     Weight 140 lb (63.5 kg)     Height 5\' 6"  (1.676 m)     Head Circumference      Peak Flow      Pain Score 0     Pain Loc      Pain Edu?      Excl. in GC?     Constitutional: Alert and oriented x4 pleasant cooperative speaks in full clear sentences no diaphoresis Eyes: PERRL EOMI. Head: Atraumatic. Nose: No congestion/rhinnorhea. Mouth/Throat: No trismus Neck: No stridor.   Cardiovascular: Normal rate, regular rhythm. Grossly normal heart sounds.  Good peripheral circulation. Respiratory: Normal respiratory effort.  No retractions. Lungs CTAB and  moving good air Gastrointestinal: Soft nontender Musculoskeletal: No lower extremity edema   Neurologic:  Normal speech and language. No gross focal neurologic deficits are appreciated. Skin:  Skin is warm, dry and intact. No rash noted. Psychiatric: Mildly anxious appearing    ____________________________________________   DIFFERENTIAL includes but not limited to  Depression, alcohol intoxication, suicidal ideation ____________________________________________   LABS (all labs ordered are listed, but only abnormal results are displayed)  Labs Reviewed  COMPREHENSIVE METABOLIC PANEL - Abnormal; Notable for the following  components:      Result Value   Calcium 8.7 (*)    All other components within normal limits  ETHANOL - Abnormal; Notable for the following components:   Alcohol, Ethyl (B) 186 (*)    All other components within normal limits  ACETAMINOPHEN LEVEL - Abnormal; Notable for the following components:   Acetaminophen (Tylenol), Serum <10 (*)    All other components within normal limits  SALICYLATE LEVEL  CBC  URINE DRUG SCREEN, QUALITATIVE ([facility] ONLY)    Lab work reviewed by me shows elevated ethanol level __________________________________________  EKG   ____________________________________________  RADIOLOGY   ____________________________________________   PROCEDURES  Procedure(s) performed: no  Procedures  Critical Care performed: no  ____________________________________________   INITIAL IMPRESSION / ASSESSMENT AND PLAN / ED COURSE  Pertinent labs & imaging results that were available during my care of the patient were reviewed by me and considered in my medical decision making (see chart for details).   As part of my medical decision making, I reviewed the following data within the electronic MEDICAL RECORD NUMBER History obtained from family if available, nursing notes, old chart and ekg, as well as notes from prior ED visits.  The patient self presents to the emergency department after drinking alcohol this evening and getting into a verbal altercation with his mother.  He agrees to stay and speak to a TTS to discuss his options.    ----------------------------------------- 6:37 AM on 12/06/2017 -----------------------------------------  The patient feels improved and would like to go home.  He once again emphasizes that he is not suicidal and has never attempted to hurt himself in the past.  He seems quite open to going to [facility] as an outpatient to discuss anger issues.  The patient's intoxication has improved and he is able to ambulate without difficulty.  His mother  is coming to pick him up and take him home.  He feels welcome to return to the emergency department at any point.  ____________________________________________   FINAL CLINICAL IMPRESSION(S) / ED DIAGNOSES  Final diagnoses:  Stress at home  Alcoholic intoxication with complication (HCC)      NEW MEDICATIONS STARTED DURING THIS VISIT:  New Prescriptions   No medications on file     Note:  This document was prepared using Dragon voice recognition software and may include unintentional dictation errors.     Merrily Brittle, MD 12/06/17 [phone]

## 2017-12-06 NOTE — ED Triage Notes (Signed)
Patient ambulatory to triage with steady gait, without difficulty or distress noted; pt reports that his mom called police tonight because he was upset; pt denies SI or HI; pt skin cold to touch and admits to being outside drinking and took sleep aid earlier in the day

## 2017-12-06 NOTE — ED Notes (Signed)
MOTHER [facility] [place])  [phone]

## 2017-12-06 NOTE — ED Notes (Signed)
Pt discharged to home. Pt denies SI, HI, and A/V hallucinations.  Pt verbalizes understanding of discharge instructions and follow-up recomm

## 2017-12-06 NOTE — ED Notes (Addendum)
With male officer in attendance, pt removed white print short sleeve shirt, jeans, black sweat pants & flip flops--placed in labeled pt belonging bag to be secured on nursing unit and pt changed into burgandy paper scrubs

## 2017-12-06 NOTE — ED Notes (Signed)
Patient cooperative during assessment.  Patient states he was talking to his mother earlier and told her that he was stressed about his current situation in life and she felt that he was suicidal and needed help.  Patient denies making any suicidal statements.  Patient denies any history of suicide attempts.  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

## 2017-12-06 NOTE — Discharge Instructions (Signed)
Please make an appointment to follow-up with a psychiatrist and therapist for recheck and return to the emergency department for any concerns whatsoever.  It was a pleasure to take care of you today, and thank you for coming to our emergency department.  If you have any questions or concerns before leaving please ask the nurse to grab me and I'm more than happy to go through your aftercare instructions again.  If you have any concerns once you are home that you are not improving or are in fact getting worse before you can make it to your follow-up appointment, please do not hesitate to call [phone] and come back for further evaluation.  Merrily Brittle, MD  Results for orders placed or performed during the hospital encounter of 12/06/17  Comprehensive metabolic panel  Result Value Ref Range   Sodium 145 135 - 145 mmol/L   Potassium 4.2 3.5 - 5.1 mmol/L   Chloride 111 98 - 111 mmol/L   CO2 27 22 - 32 mmol/L   Glucose, Bld 92 70 - 99 mg/dL   BUN 9 6 - 20 mg/dL   Creatinine, Ser 9.60 0.61 - 1.24 mg/dL   Calcium 8.7 (L) 8.9 - 10.3 mg/dL   Total Protein 8.1 6.5 - 8.1 g/dL   Albumin 5.0 3.5 - 5.0 g/dL   AST 31 15 - 41 U/L   ALT 36 0 - 44 U/L   Alkaline Phosphatase 98 38 - 126 U/L   Total Bilirubin 0.6 0.3 - 1.2 mg/dL   GFR calc non Af Amer >60 >60 mL/min   GFR calc Af Amer >60 >60 mL/min   Anion gap 7 5 - 15  Ethanol  Result Value Ref Range   Alcohol, Ethyl (B) 186 (H) <10 mg/dL  Salicylate level  Result Value Ref Range   Salicylate Lvl <7.0 2.8 - 30.0 mg/dL  Acetaminophen level  Result Value Ref Range   Acetaminophen (Tylenol), Serum <10 (L) 10 - 30 ug/mL  cbc  Result Value Ref Range   WBC 7.5 4.0 - 10.5 K/uL   RBC 5.12 4.22 - 5.81 MIL/uL   Hemoglobin 15.3 13.0 - 17.0 g/dL   HCT 45.4 09.8 - 11.9 %   MCV 88.9 80.0 - 100.0 fL   MCH 29.9 26.0 - 34.0 pg   MCHC 33.6 30.0 - 36.0 g/dL   RDW 14.7 82.9 - 56.2 %   Platelets 264 150 - 400 K/uL   nRBC 0.0 0.0 - 0.2 %  Urine Drug Screen,  Qualitative  Result Value Ref Range   Tricyclic, Ur Screen NONE DETECTED NONE DETECTED   Amphetamines, Ur Screen NONE DETECTED NONE DETECTED   MDMA (Ecstasy)Ur Screen NONE DETECTED NONE DETECTED   Cocaine Metabolite,Ur [place] NONE DETECTED NONE DETECTED   Opiate, Ur Screen NONE DETECTED NONE DETECTED   Phencyclidine (PCP) Ur S NONE DETECTED NONE DETECTED   Cannabinoid 50 Ng, Ur [place] NONE DETECTED NONE DETECTED   Barbiturates, Ur Screen NONE DETECTED NONE DETECTED   Benzodiazepine, Ur Scrn NONE DETECTED NONE DETECTED   Methadone Scn, Ur NONE DETECTED NONE DETECTED

## 2018-03-03 ENCOUNTER — Encounter: Payer: Self-pay | Admitting: Emergency Medicine

## 2018-03-03 ENCOUNTER — Emergency Department
Admission: EM | Admit: 2018-03-03 | Discharge: 2018-03-03 | Disposition: A | Discharge status: Home / Self Care | Payer: Self-pay | Attending: Emergency Medicine | Admitting: Emergency Medicine

## 2018-03-03 ENCOUNTER — Emergency Department: Payer: Self-pay

## 2018-03-03 DIAGNOSIS — Y998 Other external cause status: Secondary | ICD-10-CM | POA: Insufficient documentation

## 2018-03-03 DIAGNOSIS — M6789 Other specified disorders of synovium and tendon, multiple sites: Secondary | ICD-10-CM

## 2018-03-03 DIAGNOSIS — Z87891 Personal history of nicotine dependence: Secondary | ICD-10-CM | POA: Insufficient documentation

## 2018-03-03 DIAGNOSIS — Z23 Encounter for immunization: Secondary | ICD-10-CM | POA: Insufficient documentation

## 2018-03-03 DIAGNOSIS — Y9389 Activity, other specified: Secondary | ICD-10-CM | POA: Insufficient documentation

## 2018-03-03 DIAGNOSIS — S61411A Laceration without foreign body of right hand, initial encounter: Secondary | ICD-10-CM

## 2018-03-03 DIAGNOSIS — S66322A Laceration of extensor muscle, fascia and tendon of right middle finger at wrist and hand level, initial encounter: Secondary | ICD-10-CM | POA: Insufficient documentation

## 2018-03-03 DIAGNOSIS — X158XXA Contact with other hot household appliances, initial encounter: Secondary | ICD-10-CM | POA: Insufficient documentation

## 2018-03-03 DIAGNOSIS — Z79899 Other long term (current) drug therapy: Secondary | ICD-10-CM | POA: Insufficient documentation

## 2018-03-03 DIAGNOSIS — R52 Pain, unspecified: Secondary | ICD-10-CM

## 2018-03-03 DIAGNOSIS — Y929 Unspecified place or not applicable: Secondary | ICD-10-CM | POA: Insufficient documentation

## 2018-03-03 MED ORDER — LIDOCAINE HCL (PF) 1 % IJ SOLN
INTRAMUSCULAR | Status: AC
  Filled 2018-03-03: qty 5

## 2018-03-03 MED ORDER — IBUPROFEN 600 MG PO TABS
600.0000 mg | ORAL_TABLET | Freq: Once | ORAL | Status: AC
  Administered 2018-03-03: 600 mg via ORAL
  Filled 2018-03-03: qty 1

## 2018-03-03 MED ORDER — TETANUS-DIPHTH-ACELL PERTUSSIS 5-2.5-18.5 LF-MCG/0.5 IM SUSP
0.5000 mL | Freq: Once | INTRAMUSCULAR | Status: AC
  Administered 2018-03-03: 0.5 mL via INTRAMUSCULAR
  Filled 2018-03-03: qty 0.5

## 2018-03-03 MED ORDER — CEPHALEXIN 500 MG PO CAPS
500.0000 mg | ORAL_CAPSULE | Freq: Once | ORAL | Status: AC
  Administered 2018-03-03: 500 mg via ORAL
  Filled 2018-03-03: qty 1

## 2018-03-03 MED ORDER — CEPHALEXIN 500 MG PO CAPS: 500.0000 mg | ORAL_CAPSULE | Freq: Three times a day (TID) | ORAL | 0 refills | Status: DC

## 2018-03-03 MED ORDER — HYDROCODONE-ACETAMINOPHEN 5-325 MG PO TABS
1.0000 | ORAL_TABLET | Freq: Once | ORAL | Status: AC
  Administered 2018-03-03: 1 via ORAL
  Filled 2018-03-03: qty 1

## 2018-03-03 MED ORDER — BACITRACIN ZINC 500 UNIT/GM EX OINT
TOPICAL_OINTMENT | Freq: Once | CUTANEOUS | Status: AC
  Administered 2018-03-03: 1 via TOPICAL
  Filled 2018-03-03: qty 0.9

## 2018-03-03 MED ORDER — HYDROCODONE-ACETAMINOPHEN 5-325 MG PO TABS: 1.0000 | ORAL_TABLET | Freq: Four times a day (QID) | ORAL | 0 refills | Status: DC | PRN

## 2018-03-03 NOTE — Discharge Instructions (Addendum)
1.  You have an injury to the tendon of your hand.  Please make an appointment with the specialist provided. 2.  Take antibiotic as prescribed (Keflex 500 mg 3 times daily x7 days). 3.  You may take Norco as needed for pain. 4.  Suture removal in 7 to 10 days. 5.  Return to the ER for worsening symptoms, increased redness/swelling, purulent discharge or other concerns.

## 2018-03-03 NOTE — ED Provider Notes (Signed)
[facility] Emergency Department Provider Note   ____________________________________________   First MD Initiated Contact with Patient 03/03/18 [phone]     (approximate)  I have reviewed the triage vital signs and the nursing notes.   HISTORY  Chief Complaint Laceration    HPI Anthony Snow is a 33 y.o. male who presents to the ED from home status post right hand laceration.  Patient admits he was intoxicated and punched a light bulb.  Patient is right-hand dominant.  Presents with laceration to his dorsal right hand with bleeding controlled.  Tetanus is not up-to-date.  Denies extremity weakness, numbness or tingling.  Voices no other complaints or injuries.   Past Medical History:  Diagnosis Date  . Traumatic brain injury (HCC)     There are no active problems to display for this patient.   Past Surgical History:  Procedure Laterality Date  . APPENDECTOMY      Prior to Admission medications   Medication Sig Start Date End Date Taking? Authorizing Provider  cephALEXin (KEFLEX) 500 MG capsule Take 1 capsule (500 mg total) by mouth 3 (three) times daily. 03/03/18   Irean Hong, MD  diazepam (VALIUM) 5 MG tablet Take 1 tablet (5 mg total) by mouth every 12 (twelve) hours as needed for anxiety. 05/27/17 05/27/18  Willy Eddy, MD  famotidine (PEPCID) 20 MG tablet Take 1 tablet (20 mg total) by mouth 2 (two) times daily. 10/28/15   Emily Filbert, MD  HYDROcodone-acetaminophen (NORCO) 5-325 MG tablet Take 1 tablet by mouth every 6 (six) hours as needed for moderate pain. 03/03/18   Irean Hong, MD    Allergies Patient has no known allergies.  No family history on file.  Social History Social History   Tobacco Use  . Smoking status: Former Games developer  . Smokeless tobacco: Never Used  Substance Use Topics  . Alcohol use: Yes  . Drug use: Yes    Types: Marijuana    Review of Systems  Constitutional: No fever/chills Eyes: No  visual changes. ENT: No sore throat. Cardiovascular: Denies chest pain. Respiratory: Denies shortness of breath. Gastrointestinal: No abdominal pain.  No nausea, no vomiting.  No diarrhea.  No constipation. Genitourinary: Negative for dysuria. Musculoskeletal: Positive for right hand laceration.  Negative for back pain. Skin: Negative for rash. Neurological: Negative for headaches, focal weakness or numbness.   ____________________________________________   PHYSICAL EXAM:  VITAL SIGNS: ED Triage Vitals  Enc Vitals Group     BP 03/03/18 0022 (!) 145/77     Pulse Rate 03/03/18 0022 89     Resp 03/03/18 0022 18     Temp 03/03/18 0022 (!) 97.5 F (36.4 C)     Temp Source 03/03/18 0022 Oral     SpO2 03/03/18 0022 100 %     Weight 03/03/18 0023 165 lb (74.8 kg)     Height 03/03/18 0023 6\' 1"  (1.854 m)     Head Circumference --      Peak Flow --      Pain Score 03/03/18 0023 3     Pain Loc --      Pain Edu? --      Excl. in GC? --     Constitutional: Alert and oriented. Well appearing and in no acute distress.  Mildly intoxicated. Eyes: Conjunctivae are normal. PERRL. EOMI. Head: Atraumatic. Nose: No congestion/rhinnorhea. Mouth/Throat: Mucous membranes are moist.  Oropharynx non-erythematous. Neck: No stridor.  No cervical spine tenderness to palpation. Cardiovascular: Normal  rate, regular rhythm. Grossly normal heart sounds.  Good peripheral circulation. Respiratory: Normal respiratory effort.  No retractions. Lungs CTAB. Gastrointestinal: Soft and nontender. No distention. No abdominal bruits. No CVA tenderness. Musculoskeletal:  Right hand: Approximately 1 cm horizontally linear laceration across the base of right third phalanx on the dorsal aspect.  Patient able to flex without difficulty.  Limited extension of third finger.  Completely lacerated tendon visualized.  2+ radial pulse.  Brisk, less than 5-second capillary refill. Neurologic:  Normal speech and language. No  gross focal neurologic deficits are appreciated. No gait instability. Skin:  Skin is warm, dry and intact. No rash noted. Psychiatric: Mood and affect are normal. Speech and behavior are normal.  ____________________________________________   LABS (all labs ordered are listed, but only abnormal results are displayed)  Labs Reviewed - No data to display ____________________________________________  EKG  None ____________________________________________  RADIOLOGY  ED MD interpretation: No fracture, dislocation or foreign body  Official radiology report(s): Dg Hand Complete Right  Result Date: 03/03/2018 CLINICAL DATA:  Laceration after punching a glass door EXAM: RIGHT HAND - COMPLETE 3+ VIEW COMPARISON:  None. FINDINGS: There is no evidence of fracture or dislocation. There is no evidence of arthropathy or other focal bone abnormality. Soft tissues are unremarkable. IMPRESSION: Negative. Electronically Signed   By: Deatra Robinson M.D.   On: 03/03/2018 01:20    ____________________________________________   PROCEDURES  Procedure(s) performed:     Marland KitchenMarland KitchenLaceration Repair Date/Time: 03/03/2018 5:31 AM Performed by: Irean Hong, MD Authorized by: Irean Hong, MD   Consent:    Consent obtained:  Verbal   Consent given by:  Patient   Risks discussed:  Infection, pain, poor cosmetic result and poor wound healing Anesthesia (see MAR for exact dosages):    Anesthesia method:  Local infiltration   Local anesthetic:  Lidocaine 1% w/o epi Laceration details:    Location:  Hand   Hand location:  R hand, dorsum   Length (cm):  1 Repair type:    Repair type:  Simple Pre-procedure details:    Preparation:  Patient was prepped and draped in usual sterile fashion Exploration:    Hemostasis achieved with:  Direct pressure   Wound exploration: entire depth of wound probed and visualized     Contaminated: no   Treatment:    Area cleansed with:  Saline and Betadine   Amount of  cleaning:  Extensive   Irrigation solution:  Sterile saline   Visualized foreign bodies/material removed: no   Skin repair:    Repair method:  Sutures   Suture size:  5-0   Suture material:  Nylon   Suture technique:  Simple interrupted   Number of sutures:  3 Approximation:    Approximation:  Loose Post-procedure details:    Dressing:  Sterile dressing and splint for protection   Patient tolerance of procedure:  Tolerated well, no immediate complications    Critical Care performed: No  ____________________________________________   INITIAL IMPRESSION / ASSESSMENT AND PLAN / ED COURSE  As part of my medical decision making, I reviewed the following data within the electronic MEDICAL RECORD NUMBER History obtained from family, Nursing notes reviewed and incorporated and Notes from prior ED visits   33 year old male who presents with right third digit extensor laceration status post punching light bulb.  Will update tetanus, administer analgesia.  Will thoroughly cleanse wound.  Loosely suture skin over extensor laceration and refer to hand surgery for outpatient follow-up.  Clinical Course as of  Mar 03 530  Sat Mar 03, 2018  0532 Patient tolerated procedure well.  Strongly encouraged him to follow-up with hand surgery.  Strict return precautions given.  Patient and family member verbalize understanding and agree with plan of care.   [JS]    Clinical Course User Index [JS] Irean HongSung, Cumi Sanagustin J, MD     ____________________________________________   FINAL CLINICAL IMPRESSION(S) / ED DIAGNOSES  Final diagnoses:  Laceration of right hand without foreign body, initial encounter  Extensor tendon disruption     ED Discharge Orders         Ordered    cephALEXin (KEFLEX) 500 MG capsule  3 times daily     03/03/18 0437    HYDROcodone-acetaminophen (NORCO) 5-325 MG tablet  Every 6 hours PRN     03/03/18 0437           Note:  This document was prepared using Dragon voice  recognition software and may include unintentional dictation errors.    Irean HongSung, Bobbie Virden J, MD 03/03/18 [phone]

## 2018-03-03 NOTE — ED Notes (Signed)
Pt soaking hand in diluted betadine solution per md.

## 2018-03-03 NOTE — ED Notes (Signed)
Patient laying in floor with eyes closed in no acute distress at this time.

## 2018-03-03 NOTE — ED Notes (Signed)
Pt. Going home with family and friend

## 2018-03-03 NOTE — ED Notes (Signed)
Family given update on wait time. Family verbalizes understanding.  

## 2018-03-03 NOTE — ED Triage Notes (Addendum)
Patient states that he punched a glass door. Patient with laceration to right hand with bleeding controlled. Patient states that he has been drinking ETOH tonight.

## 2018-06-17 ENCOUNTER — Other Ambulatory Visit: Payer: Self-pay

## 2018-06-17 ENCOUNTER — Emergency Department
Admission: EM | Admit: 2018-06-17 | Discharge: 2018-06-17 | Disposition: A | Discharge status: Home / Self Care | Payer: Medicaid Other | Attending: Emergency Medicine | Admitting: Emergency Medicine

## 2018-06-17 ENCOUNTER — Encounter: Payer: Self-pay | Admitting: Emergency Medicine

## 2018-06-17 DIAGNOSIS — F10920 Alcohol use, unspecified with intoxication, uncomplicated: Secondary | ICD-10-CM | POA: Insufficient documentation

## 2018-06-17 DIAGNOSIS — F10929 Alcohol use, unspecified with intoxication, unspecified: Secondary | ICD-10-CM

## 2018-06-17 DIAGNOSIS — F17210 Nicotine dependence, cigarettes, uncomplicated: Secondary | ICD-10-CM | POA: Insufficient documentation

## 2018-06-17 HISTORY — DX: Other specified behavioral and emotional disorders with onset usually occurring in childhood and adolescence: F98.8

## 2018-06-17 LAB — URINE DRUG SCREEN, QUALITATIVE (ARMC ONLY)
Amphetamines, Ur Screen: NOT DETECTED
Barbiturates, Ur Screen: NOT DETECTED
Benzodiazepine, Ur Scrn: NOT DETECTED
Cannabinoid 50 Ng, Ur ~~LOC~~: NOT DETECTED
Cocaine Metabolite,Ur ~~LOC~~: NOT DETECTED
MDMA (Ecstasy)Ur Screen: NOT DETECTED
Methadone Scn, Ur: NOT DETECTED
Opiate, Ur Screen: NOT DETECTED
Phencyclidine (PCP) Ur S: NOT DETECTED
Tricyclic, Ur Screen: NOT DETECTED

## 2018-06-17 LAB — CBC WITH DIFFERENTIAL/PLATELET
Abs Immature Granulocytes: 0.01 10*3/uL (ref 0.00–0.07)
Basophils Absolute: 0.1 10*3/uL (ref 0.0–0.1)
Basophils Relative: 1 %
Eosinophils Absolute: 0.2 10*3/uL (ref 0.0–0.5)
Eosinophils Relative: 2 %
HCT: 41.8 % (ref 39.0–52.0)
Hemoglobin: 14.4 g/dL (ref 13.0–17.0)
Immature Granulocytes: 0 %
Lymphocytes Relative: 37 %
Lymphs Abs: 3.1 10*3/uL (ref 0.7–4.0)
MCH: 29.2 pg (ref 26.0–34.0)
MCHC: 34.4 g/dL (ref 30.0–36.0)
MCV: 84.8 fL (ref 80.0–100.0)
Monocytes Absolute: 0.6 10*3/uL (ref 0.1–1.0)
Monocytes Relative: 7 %
Neutro Abs: 4.6 10*3/uL (ref 1.7–7.7)
Neutrophils Relative %: 53 %
Platelets: 288 10*3/uL (ref 150–400)
RBC: 4.93 MIL/uL (ref 4.22–5.81)
RDW: 12.5 % (ref 11.5–15.5)
WBC: 8.5 10*3/uL (ref 4.0–10.5)
nRBC: 0 % (ref 0.0–0.2)

## 2018-06-17 LAB — ETHANOL: Alcohol, Ethyl (B): 241 mg/dL — ABNORMAL HIGH (ref <10)

## 2018-06-17 LAB — SALICYLATE LEVEL: Salicylate Lvl: <7 mg/dL (ref 2.8–30.0)

## 2018-06-17 LAB — COMPREHENSIVE METABOLIC PANEL
ALT: 24 U/L (ref 0–44)
AST: 42 U/L — ABNORMAL HIGH (ref 15–41)
Albumin: 4.9 g/dL (ref 3.5–5.0)
Alkaline Phosphatase: 108 U/L (ref 38–126)
Anion gap: 11 (ref 5–15)
BUN: 14 mg/dL (ref 6–20)
CO2: 23 mmol/L (ref 22–32)
Calcium: 8.4 mg/dL — ABNORMAL LOW (ref 8.9–10.3)
Chloride: 109 mmol/L (ref 98–111)
Creatinine, Ser: 0.88 mg/dL (ref 0.61–1.24)
GFR calc Af Amer: >60 mL/min (ref >60)
GFR calc non Af Amer: >60 mL/min (ref >60)
Glucose, Bld: 92 mg/dL (ref 70–99)
Potassium: 3.7 mmol/L (ref 3.5–5.1)
Sodium: 143 mmol/L (ref 135–145)
Total Bilirubin: 0.6 mg/dL (ref 0.3–1.2)
Total Protein: 8 g/dL (ref 6.5–8.1)

## 2018-06-17 LAB — ACETAMINOPHEN LEVEL: Acetaminophen (Tylenol), Serum: <10 ug/mL — ABNORMAL LOW (ref 10–30)

## 2018-06-17 MED ORDER — SODIUM CHLORIDE 0.9 % IV BOLUS
1000.0000 mL | Freq: Once | INTRAVENOUS | Status: AC
  Administered 2018-06-17: 05:00:00 1000 mL via INTRAVENOUS

## 2018-06-17 MED ORDER — LORAZEPAM 2 MG/ML IJ SOLN
2.0000 mg | Freq: Once | INTRAMUSCULAR | Status: AC
  Administered 2018-06-17: 03:00:00 2 mg via INTRAMUSCULAR

## 2018-06-17 MED ORDER — SODIUM CHLORIDE 0.9 % IV BOLUS
1000.0000 mL | Freq: Once | INTRAVENOUS | Status: AC
  Administered 2018-06-17: 03:00:00 1000 mL via INTRAVENOUS

## 2018-06-17 MED ORDER — LORAZEPAM 2 MG/ML IJ SOLN
INTRAMUSCULAR | Status: AC
  Administered 2018-06-17: 2 mg via INTRAMUSCULAR
  Filled 2018-06-17: qty 1

## 2018-06-17 MED ORDER — ZIPRASIDONE MESYLATE 20 MG IM SOLR
10.0000 mg | Freq: Once | INTRAMUSCULAR | Status: AC
  Administered 2018-06-17: 10 mg via INTRAMUSCULAR

## 2018-06-17 MED ORDER — LORAZEPAM 2 MG/ML IJ SOLN
1.0000 mg | Freq: Once | INTRAMUSCULAR | Status: AC
  Administered 2018-06-17: 05:00:00 1 mg via INTRAVENOUS

## 2018-06-17 MED ORDER — LORAZEPAM 2 MG/ML IJ SOLN
INTRAMUSCULAR | Status: AC
  Administered 2018-06-17: 1 mg via INTRAVENOUS
  Filled 2018-06-17: qty 1

## 2018-06-17 NOTE — ED Provider Notes (Addendum)
-----------------------------------------   7:27 AM on 06/17/2018 -----------------------------------------  Signed out to me at this time, patient was intoxicated and IVC for intoxication of her SI HI, according to referral physician, Dr. Dolores Frame, when patient is awake and alert if he is behaving himself inways that are safe, he will be allowed to be discharged  ----------------------------------------- 11:46 AM on 06/17/2018 -----------------------------------------  Awake and alert no acute distress psychiatry is reverse his IVC, we will discharge he has no SI or HI and contracts for safety   Jeanmarie Plant, MD 06/17/18 [id number]    Jeanmarie Plant, MD 06/17/18 1146

## 2018-06-17 NOTE — ED Notes (Signed)
Patient wakes to verbal stimuli.  MAE equally and strong.  Remains drowsy.  Continue to monitor.

## 2018-06-17 NOTE — Discharge Instructions (Signed)
Drink alcohol only in moderation.  Return to the ER for worsening symptoms, persistent vomiting, difficulty breathing or other concerns. 

## 2018-06-17 NOTE — ED Provider Notes (Signed)
[facility] Emergency Department Provider Note   ____________________________________________   First MD Initiated Contact with Patient 06/17/18 [phone]     (approximate)  I have reviewed the triage vital signs and the nursing notes.   HISTORY  Chief Complaint Drug Overdose  Level V caveat: Limited by intoxication  HPI Anthony Snow is a 33 y.o. male brought to the ED from home via EMS with a chief complaint of "I am fucking high".  Patient reports smoking marijuana and thinks it was laced with something because he feels higher than usual and is "freaking out".  Denies intentional overdose.  Unable to provide rest of history secondary to intoxication.       Past Medical History:  Diagnosis Date  . ADD (attention deficit disorder)   . Traumatic brain injury (HCC)     There are no active problems to display for this patient.   Past Surgical History:  Procedure Laterality Date  . APPENDECTOMY      Prior to Admission medications   Medication Sig Start Date End Date Taking? Authorizing Provider  cephALEXin (KEFLEX) 500 MG capsule Take 1 capsule (500 mg total) by mouth 3 (three) times daily. 03/03/18   Irean HongSung, Nusrat Encarnacion J, MD  famotidine (PEPCID) 20 MG tablet Take 1 tablet (20 mg total) by mouth 2 (two) times daily. 10/28/15   Emily FilbertWilliams, Jonathan E, MD  HYDROcodone-acetaminophen (NORCO) 5-325 MG tablet Take 1 tablet by mouth every 6 (six) hours as needed for moderate pain. 03/03/18   Irean HongSung, Dervin Vore J, MD    Allergies Patient has no known allergies.  No family history on file.  Social History Social History   Tobacco Use  . Smoking status: Current Every Day Smoker  . Smokeless tobacco: Never Used  Substance Use Topics  . Alcohol use: Yes  . Drug use: Yes    Types: Marijuana    Review of Systems  Constitutional: No fever/chills Eyes: No visual changes. ENT: No sore throat. Cardiovascular: Denies chest pain. Respiratory: Denies shortness of  breath. Gastrointestinal: No abdominal pain.  No nausea, no vomiting.  No diarrhea.  No constipation. Genitourinary: Negative for dysuria. Musculoskeletal: Negative for back pain. Skin: Negative for rash. Neurological: Negative for headaches, focal weakness or numbness. Psychiatric:  Positive for substance use.  ____________________________________________   PHYSICAL EXAM:  VITAL SIGNS: ED Triage Vitals  Enc Vitals Group     BP --      Pulse Rate 06/17/18 0227 (!) 112     Resp 06/17/18 0227 20     Temp 06/17/18 0227 98.1 F (36.7 C)     Temp Source 06/17/18 0227 Oral     SpO2 06/17/18 0227 98 %     Weight --      Height --      Head Circumference --      Peak Flow --      Pain Score 06/17/18 0228 0     Pain Loc --      Pain Edu? --      Excl. in GC? --     Constitutional: Alert and oriented.  Intoxicated appearing and in no acute distress. Eyes: Conjunctivae are normal. PERRL. EOMI. Head: Atraumatic. Nose: Atraumatic. Mouth/Throat: Mucous membranes are moist.  No dental malocclusion. Neck: No stridor.  No cervical spine tenderness to palpation. Cardiovascular: Tachycardic rate, regular rhythm. Grossly normal heart sounds.  Good peripheral circulation. Respiratory: Normal respiratory effort.  No retractions. Lungs CTAB. Gastrointestinal: Soft and nontender. No distention. No abdominal  bruits. No CVA tenderness. Musculoskeletal: No lower extremity tenderness nor edema.  No joint effusions. Neurologic:  Normal speech and language. No gross focal neurologic deficits are appreciated. MAEx4. Skin:  Skin is warm, dry and intact. No rash noted. Psychiatric: Mood and affect are intoxicated. Speech and behavior are pressured.  ____________________________________________   LABS (all labs ordered are listed, but only abnormal results are displayed)  Labs Reviewed  COMPREHENSIVE METABOLIC PANEL - Abnormal; Notable for the following components:      Result Value   Calcium  8.4 (*)    AST 42 (*)    All other components within normal limits  ETHANOL - Abnormal; Notable for the following components:   Alcohol, Ethyl (B) 241 (*)    All other components within normal limits  ACETAMINOPHEN LEVEL - Abnormal; Notable for the following components:   Acetaminophen (Tylenol), Serum <10 (*)    All other components within normal limits  CBC WITH DIFFERENTIAL/PLATELET  SALICYLATE LEVEL  URINE DRUG SCREEN, QUALITATIVE ([facility] ONLY)   ____________________________________________  EKG  ED ECG REPORT I, Velvie Thomaston J, the attending physician, personally viewed and interpreted this ECG.   Date: 06/17/2018  EKG Time: 0315  Rate: 88  Rhythm: normal EKG, normal sinus rhythm  Axis: Normal  Intervals:none  ST&T Change: Nonspecific  ____________________________________________  RADIOLOGY  ED MD interpretation: None  Official radiology report(s): No results found.  ____________________________________________   PROCEDURES  Procedure(s) performed (including Critical Care):  Procedures  CRITICAL CARE Performed by: Irean Hong   Total critical care time: 30 minutes  Critical care time was exclusive of separately billable procedures and treating other patients.  Critical care was necessary to treat or prevent imminent or life-threatening deterioration.  Critical care was time spent personally by me on the following activities: development of treatment plan with patient and/or surrogate as well as nursing, discussions with consultants, evaluation of patient's response to treatment, examination of patient, obtaining history from patient or surrogate, ordering and performing treatments and interventions, ordering and review of laboratory studies, ordering and review of radiographic studies, pulse oximetry and re-evaluation of patient's condition.  ____________________________________________   INITIAL IMPRESSION / ASSESSMENT AND PLAN / ED COURSE  As part of my  medical decision making, I reviewed the following data within the electronic MEDICAL RECORD NUMBER Nursing notes reviewed and incorporated, Labs reviewed, EKG interpreted, Old chart reviewed and Notes from prior ED visits     Anthony Snow was evaluated in Emergency Department on 06/17/2018 for the symptoms described in the history of present illness. He was evaluated in the context of the global COVID-19 pandemic, which necessitated consideration that the patient might be at risk for infection with the SARS-CoV-2 virus that causes COVID-19. Institutional protocols and algorithms that pertain to the evaluation of patients at risk for COVID-19 are in a state of rapid change based on information released by regulatory bodies including the CDC and federal and state organizations. These policies and algorithms were followed during the patient's care in the ED.   33 year old male brought in for intoxication secondary to substance use.  Mother called to state he was smoking cigarettes and also had "cigarette patches on his back".  She gave 2 baby aspirin prior to calling EMS.  There is no history of trauma.  Patient is currently combative and belligerent.  Will attempt to redirect patient verbally.  Consider administering IM calming agent.   Clinical Course as of Jun 17 654  Sun Jun 17, 2018  7829 Patient resting  comfortably after administration of IM Ativan.  Laboratory results and urinalysis noted.   [JS]  [id number] Patient attempting to get up and ripped out his IV.  Agrees to lay back down for second liter IV fluids.  Will give additional IV Ativan for calming.   [JS]  [id number] Multiple attempts at redirecting patient unsuccessful.  He repeatedly tries to get up and ripped out his IV.  He is still intoxicated and exhibiting lack of judgment.  He is a danger to himself and my staff at this time.  For this reason we will place him under involuntary commitment.  We will plan to rescind IVC once he is sober and  cooperative.   [JS]  [id number] Patient sleeping soundly.  Care transferred to Dr. Alphonzo Lemmings at change of shift.  Once patient is sober his IVC may be rescinded and he may be discharged home.   [JS]    Clinical Course User Index [JS] Irean Hong, MD     ____________________________________________   FINAL CLINICAL IMPRESSION(S) / ED DIAGNOSES  Final diagnoses:  Alcoholic intoxication without complication [facility])     ED Discharge Orders    None       Note:  This document was prepared using Dragon voice recognition software and may include unintentional dictation errors.   Irean Hong, MD 06/17/18 [phone]

## 2018-06-17 NOTE — ED Notes (Signed)
Patient oxygen saturation decreasing to 85%. Patient placed on 2L [place]. Patient oxygen saturation increased to 99%. Dr. Dolores Frame notified.

## 2018-06-17 NOTE — ED Notes (Signed)
Patient AAOx4. Vitals Stable. Verbalized Discharge instructions.

## 2018-06-17 NOTE — ED Notes (Addendum)
Patient continues to become agitated trying to leave. Patient remains confused and uncooperative. Forensic restraints applied to bilateral wrist per [employer]. New orders received for Geodon.

## 2018-06-17 NOTE — ED Notes (Addendum)
Patient becoming increasingly agitated. Patient getting out of the bed and trying to leave. Patient encouraged to get back in bed. Security and [employer] at the bedside. Dr. Dolores Frame notified and orders for Ativan.

## 2018-06-17 NOTE — BH Assessment (Signed)
Per request of Psych MD (Dr. Thalia Party), writer provided the patient with information and instructions on how to access Outpatient Mental Health & Substance Abuse Treatment ([facility] and [employer])   Patient denies SI/HI and AV/H.  ___________ [facility] [address]  [place], [place] [address] [phone]  [facility] [address]  [place], [place] [address] [phone]

## 2018-06-17 NOTE — BH Assessment (Signed)
Assessment Note  Anthony MonksRobert J Snow is an 33 y.o. male who presents to the ER via law enforcement, after he was found intoxicated laying near the road. Upon arrival to the ER, patient was agitated and aggressive. When was giving medications to help him calm down.   Patient states, prior to the arrival to the ER, he and his cousin was smoking cannabis. They eventually had an argument and both of them called law enforcement, on each other. Patient reports of having no knowledge of the events that took place following the phone call.  During the interview, the patient was calm, cooperative and pleasant. He was able to provide appropriate answers to the questions. Throughout the interview, he denied SI/HI and AV/H. He admits to the use of alcohol and cannabis. Patient believes, he was giving synthetic marijuana because his UDS was negative for THC.  His BAC was 241.  Diagnosis: Alcohol Use Disorder  Past Medical History:  Past Medical History:  Diagnosis Date  . ADD (attention deficit disorder)   . Traumatic brain injury [facility](HCC)     Past Surgical History:  Procedure Laterality Date  . APPENDECTOMY      Family History: No family history on file.  Social History:  reports that he has been smoking. He has never used smokeless tobacco. He reports current alcohol use. He reports current drug use. Drug: Marijuana.  Additional Social History:  Alcohol / Drug Use Pain Medications: See PTA Prescriptions: See PTA Over the Counter: See PTA History of alcohol / drug use?: Yes Longest period of sobriety (when/how long): Unable to quantify Negative Consequences of Use: Personal relationships, Work / School Substance #1 Name of Substance 1: Alcohol 1 - Last Use / Amount: 06/16/2018 Substance #2 Name of Substance 2: Cannabis 2 - Last Use / Amount: Unable to quantify  CIWA: CIWA-Ar BP: 126/72 Pulse Rate: 72 COWS:    Allergies: No Known Allergies  Home Medications: (Not in a hospital  admission)   OB/GYN Status:  No LMP for male patient.  General Assessment Data [facility] Assessment: In system Is this a Tele or Face-to-Face Assessment?: Face-to-Face Is this an Initial Assessment or a Re-assessment for this encounter?: Initial Assessment Patient Accompanied by:: N/A Language Other than English: No Living Arrangements: Other (Comment)(Private Home) What gender do you identify as?: Male Marital status: Single Pregnancy Status: No Living Arrangements: Parent Can pt return to current living arrangement?: Yes Admission Status: Involuntary Petitioner: Police Is patient capable of signing voluntary admission?: No(Under IVC) Referral Source: Self/Family/Friend Insurance type: None  Medical Screening Exam [facility] Walk-in ONLY) Medical Exam completed: Yes  Crisis Care Plan Living Arrangements: Parent Name of Psychiatrist: Reports of none Name of Therapist: Reports of none  Education Status Is patient currently in school?: No Is the patient employed, unemployed or receiving disability?: Unemployed  Risk to self with the past 6 months Suicidal Ideation: No Has patient been a risk to self within the past 6 months prior to admission? : No Suicidal Intent: No Has patient had any suicidal intent within the past 6 months prior to admission? : No Is patient at risk for suicide?: No Suicidal Plan?: No Has patient had any suicidal plan within the past 6 months prior to admission? : No Access to Means: No What has been your use of drugs/alcohol within the last 12 months?: Alcohol & Cannabis Previous Attempts/Gestures: No How many times?: 0 Other Self Harm Risks: Active Drug use Triggers for Past Attempts: None known  Intentional Self Injurious Behavior: None Family Suicide History: Unknown Recent stressful life event(s): Loss (Comment), Other (Comment) Persecutory voices/beliefs?: No Depression: Yes Depression Symptoms: Feeling worthless/self  pity, Guilt Substance abuse history and/or treatment for substance abuse?: Yes Suicide prevention information given to non-admitted patients: Not applicable  Risk to Others within the past 6 months Homicidal Ideation: No Does patient have any lifetime risk of violence toward others beyond the six months prior to admission? : No Thoughts of Harm to Others: No Current Homicidal Intent: No Current Homicidal Plan: No Access to Homicidal Means: No Identified Victim: Reports of none History of harm to others?: No Assessment of Violence: On admission Violent Behavior Description: Agitated upon arrival to the ER Does patient have access to weapons?: No Criminal Charges Pending?: No Does patient have a court date: No Is patient on probation?: No  Psychosis Hallucinations: None noted Delusions: None noted  Mental Status Report Appearance/Hygiene: Unremarkable Eye Contact: Fair Motor Activity: Unable to assess Speech: Logical/coherent, Slurred, Soft Level of Consciousness: Alert Mood: Sad, Pleasant Affect: Appropriate to circumstance, Sad Anxiety Level: None Thought Processes: Coherent, Relevant Judgement: Unimpaired Orientation: Person, Place, Time, Situation, Appropriate for developmental age Obsessive Compulsive Thoughts/Behaviors: Minimal  Cognitive Functioning Concentration: Normal Memory: Remote Intact, Recent Impaired Is patient IDD: No Insight: Fair Impulse Control: Fair Appetite: Good Have you had any weight changes? : No Change Sleep: No Change Total Hours of Sleep: 8 Vegetative Symptoms: None  ADLScreening [facility] Assessment Services) Patient's cognitive ability adequate to safely complete daily activities?: Yes Patient able to express need for assistance with ADLs?: Yes Independently performs ADLs?: Yes (appropriate for developmental age)  Prior Inpatient Therapy Prior Inpatient Therapy: No  Prior Outpatient Therapy Prior Outpatient Therapy: No Does patient  have an ACCT team?: No Does patient have Intensive In-House Services?  : No Does patient have [facility] services? : No Does patient have P4CC services?: No  ADL Screening (condition at time of admission) Patient's cognitive ability adequate to safely complete daily activities?: Yes Is the patient deaf or have difficulty hearing?: No Does the patient have difficulty seeing, even when wearing glasses/contacts?: No Does the patient have difficulty concentrating, remembering, or making decisions?: No Patient able to express need for assistance with ADLs?: Yes Does the patient have difficulty dressing or bathing?: No Independently performs ADLs?: Yes (appropriate for developmental age) Does the patient have difficulty walking or climbing stairs?: No Weakness of Legs: None Weakness of Arms/Hands: None  Home Assistive Devices/Equipment Home Assistive Devices/Equipment: None  Therapy Consults (therapy consults require a physician order) PT Evaluation Needed: No OT Evalulation Needed: No SLP Evaluation Needed: No Abuse/Neglect Assessment (Assessment to be complete while patient is alone) Abuse/Neglect Assessment Can Be Completed: Yes Physical Abuse: Denies Verbal Abuse: Denies Sexual Abuse: Denies Exploitation of patient/patient's resources: Denies Self-Neglect: Denies Values / Beliefs Cultural Requests During Hospitalization: None Spiritual Requests During Hospitalization: None Consults Spiritual Care Consult Needed: No Social Work Consult Needed: No Merchant navy officer (For Healthcare) Does Patient Have a Medical Advance Directive?: No Would patient like information on creating a medical advance directive?: No - Patient declined       Child/Adolescent Assessment Running Away Risk: Denies(Patient is an adult)  Disposition:  Disposition Initial Assessment Completed for this Encounter: Yes  On Site Evaluation by:   Reviewed with Physician:    Lilyan Gilford MS, LCAS,  [facility], [facility], CCSI Therapeutic Triage Specialist 06/17/2018 12:17 PM

## 2018-06-17 NOTE — ED Triage Notes (Signed)
Patient brought in by ems from home. Ems was called out by PD. Patient was smoking marijuana and thinks that it was laced with something. Patient keeps stating that "I am fucking high as a mother fucking kite and I am freaking out."

## 2018-06-17 NOTE — ED Notes (Signed)
ED Provider at bedside. 

## 2018-06-17 NOTE — Consult Note (Signed)
[facility] Face-to-Face Psychiatry Consult   Reason for Consult:  Psych eval due to agitation, bizarre behavior. Referring Physician:  Dr Alphonzo Lemmings Patient Identification: Anthony Snow MRN:  [id number] Principal Diagnosis: <principal problem not specified> Diagnosis:  Alcohol use disorder. Acute alcohol intoxication.  Total Time spent with patient: 30 minutes  Subjective:   Anthony Snow is a 33 y.o. male patient, who was brought to ED by EMS last night for intoxication with substances.  HPI: Reportedly (per chart), patient called the police, said that he is "high and freaked out", police called EMS and patient brought to ED. In the ED patient admitted to smoking THC and that he thinks it was laced with other substance. In ED patient became agitated, tried to leave. He received two doses of Ativan and one dose of  IM Geodon for sedation. Patient`s BAL was 241 on arrival, U-tox was negative for all tested substances. Patient had several similar ED presentations in the past related to acute alcohol intoxication.  Patient evaluated when clinically sober and alert. Patient reports he and his cousin were drinking and smoking cannabis last night. They eventually got into verbal altercation and to avoid physical altercation they both called to police, per patient. Patient does not remember his agitation in the ED. He reports feeling "fine" at the moment of the interview. He denies any thoughts of harming self or others, denies any hallucinations, does not express any delusions. He reports that his girlfriend broke up with him some time ago and that he has been feeling depressed since. States he was never suicidal. Reports smoking cannabis for long time, usually occasionally during the week. Reports that drinking alcohol is his "new thing" and it started about a week ago. He reports he was drinking "Baccardi" every day for a week. Patient is seeking help for his substance use and depression and is  interested in mental health and chemical-dependency resources in the area. He states he feels safe to be discharged and can contract for safety if discharged.  Past psych history: patient denies past psych hospitalizations, suicidal attempts, psych medication trials.  Social Hx: no guardian; single; three children; lives with mother; not working; denies having guns at home.  MENTAL STATUS EXAM: Appearance:  CM, appearing stated age, wearing hospital attire. Normal level of alertness and appropriate facial expression. Attitude/Behavior: calm, cooperative, engaging with appropriate eye contact. Motor: WNL; dyskinesias not evident. Speech: spontaneous, clear, coherent, normal comprehension. Mood: "fine". Affect: full range Thought process: patient appears coherent, organized, logical, goal-directed, associations are appropriate. Thought content: patient denies suicidal thoughts, denies homicidal thoughts; did not express any delusions.  Thought perception: patient denies auditory and visual hallucinations, no illusions, no depersonalizations. Did not appear internally stimulated.  Cognition: patient is alert and oriented in self, place. Insight: fair, in regards of understanding of presence, nature, cause, and significance of mental or emotional problem. Judgement: fair, in regards of ability to make good decisions concerning the appropriate thing to do in various situations, including ability to form opinions regarding their mental health condition.     PSYCH RISK ASSESSMENT: Suicide Risk Assessment: male gender, Caucasian - represent non-modifiable/baseline risk factors. Presence of alcohol/substance use is dynamic risk factors. The patient denies suicidal thoughts, denies a history of suicidal attempts, denies access to firearm. Patient is future-oriented, has responsibilities (3 kids), help-seeking, has family support - all protective factors. Therefore, represents a low risk for harming self  acutely and chronically / elevated chronic risk due to non-modifiable risk factors.  Violence Risk Assessment: male gender, younger age, substance use - are static risk factors predisposing to violence - place him at low to moderate chronic risk of harming others; though he is an acute low risk to others as he is not having any thoughts of wanting to hurt others and recognizes the consequences if he were be physically violent.    [place] Suicide Severity Rating Scale Wish to be dead: No Suicidal thoughts: No                 Suicidal thoughts with method: No                 Suicidal intent: No                 Suicide intent with specific plan: No                 Suicide behavior: No    Risk to Self:  No Risk to Others:  No Prior Inpatient Therapy:  No Prior Outpatient Therapy:  No  Past Medical History:  Past Medical History:  Diagnosis Date  . ADD (attention deficit disorder)   . Traumatic brain injury [facility])     Past Surgical History:  Procedure Laterality Date  . APPENDECTOMY     Family History: No family history on file. Family Psychiatric  History: see above Social History:  Social History   Substance and Sexual Activity  Alcohol Use Yes     Social History   Substance and Sexual Activity  Drug Use Yes  . Types: Marijuana    Social History   Socioeconomic History  . Marital status: Single    Spouse name: Not on file  . Number of children: Not on file  . Years of education: Not on file  . Highest education level: Not on file  Occupational History  . Not on file  Social Needs  . Financial resource strain: Not on file  . Food insecurity:    Worry: Not on file    Inability: Not on file  . Transportation needs:    Medical: Not on file    Non-medical: Not on file  Tobacco Use  . Smoking status: Current Every Day Smoker  . Smokeless tobacco: Never Used  Substance and Sexual Activity  . Alcohol use: Yes  . Drug use: Yes    Types: Marijuana  . Sexual  activity: Not on file  Lifestyle  . Physical activity:    Days per week: Not on file    Minutes per session: Not on file  . Stress: Not on file  Relationships  . Social connections:    Talks on phone: Not on file    Gets together: Not on file    Attends religious service: Not on file    Active member of club or organization: Not on file    Attends meetings of clubs or organizations: Not on file    Relationship status: Not on file  Other Topics Concern  . Not on file  Social History Narrative  . Not on file   Additional Social History: see above    Allergies:  No Known Allergies  Labs:  Results for orders placed or performed during the hospital encounter of 06/17/18 (from the past 48 hour(s))  CBC with Differential     Status: None   Collection Time: 06/17/18  2:47 AM  Result Value Ref Range   WBC 8.5 4.0 - 10.5 K/uL   RBC 4.93 4.22 -  5.81 MIL/uL   Hemoglobin 14.4 13.0 - 17.0 g/dL   HCT 17.0 01.7 - 49.4 %   MCV 84.8 80.0 - 100.0 fL   MCH 29.2 26.0 - 34.0 pg   MCHC 34.4 30.0 - 36.0 g/dL   RDW 49.6 75.9 - 16.3 %   Platelets 288 150 - 400 K/uL   nRBC 0.0 0.0 - 0.2 %   Neutrophils Relative % 53 %   Neutro Abs 4.6 1.7 - 7.7 K/uL   Lymphocytes Relative 37 %   Lymphs Abs 3.1 0.7 - 4.0 K/uL   Monocytes Relative 7 %   Monocytes Absolute 0.6 0.1 - 1.0 K/uL   Eosinophils Relative 2 %   Eosinophils Absolute 0.2 0.0 - 0.5 K/uL   Basophils Relative 1 %   Basophils Absolute 0.1 0.0 - 0.1 K/uL   Immature Granulocytes 0 %   Abs Immature Granulocytes 0.01 0.00 - 0.07 K/uL    Comment: Performed at [facility], [address] [place], [place] [address]  Comprehensive metabolic panel     Status: Abnormal   Collection Time: 06/17/18  2:47 AM  Result Value Ref Range   Sodium 143 135 - 145 mmol/L   Potassium 3.7 3.5 - 5.1 mmol/L   Chloride 109 98 - 111 mmol/L   CO2 23 22 - 32 mmol/L   Glucose, Bld 92 70 - 99 mg/dL   BUN 14 6 - 20 mg/dL   Creatinine, Ser 9.93 0.61 -  1.24 mg/dL   Calcium 8.4 (L) 8.9 - 10.3 mg/dL   Total Protein 8.0 6.5 - 8.1 g/dL   Albumin 4.9 3.5 - 5.0 g/dL   AST 42 (H) 15 - 41 U/L   ALT 24 0 - 44 U/L   Alkaline Phosphatase 108 38 - 126 U/L   Total Bilirubin 0.6 0.3 - 1.2 mg/dL   GFR calc non Af Amer >60 >60 mL/min   GFR calc Af Amer >60 >60 mL/min   Anion gap 11 5 - 15    Comment: Performed at [facility], [address] [place], [place] [address]  Ethanol     Status: Abnormal   Collection Time: 06/17/18  2:47 AM  Result Value Ref Range   Alcohol, Ethyl (B) 241 (H) <10 mg/dL    Comment: (NOTE) Lowest detectable limit for serum alcohol is 10 mg/dL. For medical purposes only. Performed at [facility], [address] [place], [place] [address]   Acetaminophen level     Status: Abnormal   Collection Time: 06/17/18  2:47 AM  Result Value Ref Range   Acetaminophen (Tylenol), Serum <10 (L) 10 - 30 ug/mL    Comment: (NOTE) Therapeutic concentrations vary significantly. A range of 10-30 ug/mL  may be an effective concentration for many patients. However, some  are best treated at concentrations outside of this range. Acetaminophen concentrations >150 ug/mL at 4 hours after ingestion  and >50 ug/mL at 12 hours after ingestion are often associated with  toxic reactions. Performed at [facility], [address] [place], [place] [address]   Salicylate level     Status: None   Collection Time: 06/17/18  2:47 AM  Result Value Ref Range   Salicylate Lvl <7.0 2.8 - 30.0 mg/dL    Comment: Performed at [facility], [address] [place], [place] [address]  Urine Drug Screen, Qualitative     Status: None   Collection Time: 06/17/18  2:47 AM  Result Value Ref Range  Tricyclic, Ur Screen NONE DETECTED NONE DETECTED   Amphetamines, Ur Screen NONE DETECTED NONE DETECTED   MDMA (Ecstasy)Ur Screen NONE DETECTED NONE DETECTED   Cocaine Metabolite,Ur [place] NONE DETECTED NONE DETECTED   Opiate,  Ur Screen NONE DETECTED NONE DETECTED   Phencyclidine (PCP) Ur S NONE DETECTED NONE DETECTED   Cannabinoid 50 Ng, Ur [place] NONE DETECTED NONE DETECTED   Barbiturates, Ur Screen NONE DETECTED NONE DETECTED   Benzodiazepine, Ur Scrn NONE DETECTED NONE DETECTED   Methadone Scn, Ur NONE DETECTED NONE DETECTED    Comment: (NOTE) Tricyclics + metabolites, urine    Cutoff 1000 ng/mL Amphetamines + metabolites, urine  Cutoff 1000 ng/mL MDMA (Ecstasy), urine              Cutoff 500 ng/mL Cocaine Metabolite, urine          Cutoff 300 ng/mL Opiate + metabolites, urine        Cutoff 300 ng/mL Phencyclidine (PCP), urine         Cutoff 25 ng/mL Cannabinoid, urine                 Cutoff 50 ng/mL Barbiturates + metabolites, urine  Cutoff 200 ng/mL Benzodiazepine, urine              Cutoff 200 ng/mL Methadone, urine                   Cutoff 300 ng/mL The urine drug screen provides only a preliminary, unconfirmed analytical test result and should not be used for non-medical purposes. Clinical consideration and professional judgment should be applied to any positive drug screen result due to possible interfering substances. A more specific alternate chemical method must be used in order to obtain a confirmed analytical result. Gas chromatography / mass spectrometry (GC/MS) is the preferred confirmat ory method. Performed at [facility], [address] [place], [place] [address]     No current facility-administered medications for this encounter.    Current Outpatient Medications  Medication Sig Dispense Refill  . cephALEXin (KEFLEX) 500 MG capsule Take 1 capsule (500 mg total) by mouth 3 (three) times daily. 21 capsule 0  . famotidine (PEPCID) 20 MG tablet Take 1 tablet (20 mg total) by mouth 2 (two) times daily. 60 tablet 1  . HYDROcodone-acetaminophen (NORCO) 5-325 MG tablet Take 1 tablet by mouth every 6 (six) hours as needed for moderate pain. 15 tablet 0    Psychiatric Specialty  Exam: Physical Exam  ROS  Blood pressure 114/62, pulse 87, temperature 98.1 F (36.7 C), temperature source Oral, resp. rate 15, height  (1.88 m), weight 81.6 kg, SpO2 97 %.Body mass index is 23.11 kg/m.  General Appearance: Casual  Eye Contact:  Good  Speech:  Normal Rate  Volume:  Normal  Mood:  Euthymic  Affect:  Appropriate  Thought Process:  Coherent and Goal Directed  Orientation:  Place and person  Thought Content:  Logical  Suicidal Thoughts:  No  Homicidal Thoughts:  No  Memory:  Immediate;   Fair Recent;   Poor Remote;   Fair  Judgement:  Fair  Insight:  Fair  Psychomotor Activity:  Normal  Concentration:  Concentration: Fair and Attention Span: Fair  Recall:  [employer] of Knowledge:  Fair  Language:  Good  Akathisia:  No  Hand  AIMS (if indicated):     Assets:  Communication Skills Desire for Improvement Housing Social Support  ADL's:  Intact  Cognition:  WNL  Sleep:        33 y.o. male patient, who was brought to ED by EMS last night for intoxication with substances. Evaluated when sober - patient is calm, cooperative, denies SI, HI, AVH; he does not appear to be psychotic, gravely disabled of at risk of harming self/others. He is help-seeking and accepted resources for his mental and substance use problems.  Disposition: No evidence of imminent risk to self or others at present.   Patient does not meet criteria for psychiatric inpatient admission. MH and Chem-dep resources given. Should the patient be unable to maintain his personal safety or the safety of others, instructions were provided to dial 9-1-1 or go to the closest emergency room.  Thalia PartyAlisa Micayla Brathwaite, MD 06/17/2018 11:03 AM

## 2018-06-17 NOTE — ED Notes (Signed)
Patient resting quietly with eyes closed in no acute distress. Forensic restraints removed at this time by [employer].

## 2018-12-01 ENCOUNTER — Other Ambulatory Visit: Payer: Self-pay

## 2018-12-01 ENCOUNTER — Encounter: Payer: Self-pay | Admitting: Emergency Medicine

## 2018-12-01 ENCOUNTER — Emergency Department
Admission: EM | Admit: 2018-12-01 | Discharge: 2018-12-01 | Disposition: A | Discharge status: Home / Self Care | Payer: Medicaid Other | Attending: Emergency Medicine | Admitting: Emergency Medicine

## 2018-12-01 DIAGNOSIS — F918 Other conduct disorders: Secondary | ICD-10-CM | POA: Insufficient documentation

## 2018-12-01 DIAGNOSIS — Z79899 Other long term (current) drug therapy: Secondary | ICD-10-CM | POA: Insufficient documentation

## 2018-12-01 DIAGNOSIS — F10920 Alcohol use, unspecified with intoxication, uncomplicated: Secondary | ICD-10-CM

## 2018-12-01 DIAGNOSIS — R519 Headache, unspecified: Secondary | ICD-10-CM | POA: Insufficient documentation

## 2018-12-01 DIAGNOSIS — Y907 Blood alcohol level of 200-239 mg/100 ml: Secondary | ICD-10-CM | POA: Insufficient documentation

## 2018-12-01 DIAGNOSIS — F17200 Nicotine dependence, unspecified, uncomplicated: Secondary | ICD-10-CM | POA: Insufficient documentation

## 2018-12-01 DIAGNOSIS — Z046 Encounter for general psychiatric examination, requested by authority: Secondary | ICD-10-CM | POA: Insufficient documentation

## 2018-12-01 LAB — COMPREHENSIVE METABOLIC PANEL
ALT: 21 U/L (ref 0–44)
AST: 40 U/L (ref 15–41)
Albumin: 4.5 g/dL (ref 3.5–5.0)
Alkaline Phosphatase: 76 U/L (ref 38–126)
Anion gap: 9 (ref 5–15)
BUN: 11 mg/dL (ref 6–20)
CO2: 23 mmol/L (ref 22–32)
Calcium: 8.2 mg/dL — ABNORMAL LOW (ref 8.9–10.3)
Chloride: 109 mmol/L (ref 98–111)
Creatinine, Ser: 1.06 mg/dL (ref 0.61–1.24)
GFR calc Af Amer: >60 mL/min (ref >60)
GFR calc non Af Amer: >60 mL/min (ref >60)
Glucose, Bld: 91 mg/dL (ref 70–99)
Potassium: 3.4 mmol/L — ABNORMAL LOW (ref 3.5–5.1)
Sodium: 141 mmol/L (ref 135–145)
Total Bilirubin: 0.7 mg/dL (ref 0.3–1.2)
Total Protein: 7.4 g/dL (ref 6.5–8.1)

## 2018-12-01 LAB — CBC
HCT: 40.8 % (ref 39.0–52.0)
Hemoglobin: 14 g/dL (ref 13.0–17.0)
MCH: 29.2 pg (ref 26.0–34.0)
MCHC: 34.3 g/dL (ref 30.0–36.0)
MCV: 85.2 fL (ref 80.0–100.0)
Platelets: 215 10*3/uL (ref 150–400)
RBC: 4.79 MIL/uL (ref 4.22–5.81)
RDW: 12.6 % (ref 11.5–15.5)
WBC: 11.3 10*3/uL — ABNORMAL HIGH (ref 4.0–10.5)
nRBC: 0 % (ref 0.0–0.2)

## 2018-12-01 LAB — ACETAMINOPHEN LEVEL: Acetaminophen (Tylenol), Serum: <10 ug/mL — ABNORMAL LOW (ref 10–30)

## 2018-12-01 LAB — SALICYLATE LEVEL: Salicylate Lvl: <7 mg/dL (ref 2.8–30.0)

## 2018-12-01 LAB — ETHANOL: Alcohol, Ethyl (B): 232 mg/dL — ABNORMAL HIGH (ref <10)

## 2018-12-01 MED ORDER — ZIPRASIDONE MESYLATE 20 MG IM SOLR
10.0000 mg | Freq: Once | INTRAMUSCULAR | Status: AC
  Administered 2018-12-01: 10 mg via INTRAMUSCULAR
  Filled 2018-12-01: qty 20

## 2018-12-01 MED ORDER — IBUPROFEN 600 MG PO TABS
600.0000 mg | ORAL_TABLET | Freq: Once | ORAL | Status: AC
  Administered 2018-12-01: 600 mg via ORAL
  Filled 2018-12-01: qty 1

## 2018-12-01 MED ORDER — GABAPENTIN 300 MG PO CAPS: 300.0000 mg | ORAL_CAPSULE | Freq: Three times a day (TID) | ORAL | 0 refills | Status: AC

## 2018-12-01 MED ORDER — GABAPENTIN 300 MG PO CAPS: 300.0000 mg | ORAL_CAPSULE | Freq: Three times a day (TID) | ORAL | Status: DC

## 2018-12-01 NOTE — ED Notes (Signed)
Pt discharged home. VS stable. All belongings returned to patient. Pt  denies SI/HI. Discharge instructions and prescription reviewed with patient. Pt signed for discharge.

## 2018-12-01 NOTE — ED Notes (Signed)
Patient complained of headache, nurse administered motrin po, Patient is calm and cooperative, will continue to monitor.

## 2018-12-01 NOTE — ED Notes (Signed)
Pt's mother here to visit patient. No behavioral issues. Maintained on 15 minute checks and observation by security camera for safety.

## 2018-12-01 NOTE — ED Provider Notes (Signed)
[facility] Emergency Department Provider Note   ____________________________________________   First MD Initiated Contact with Patient 12/01/18 0121     (approximate)  I have reviewed the triage vital signs and the nursing notes.   HISTORY  Chief Complaint Medical Clearance  Level V caveat: Limited by aggressiveness  HPI Anthony Snow is a 33 y.o. male brought to the ED under IVC by police for intoxication and aggressive behavior.  Found wandering in the road.  Rest of history is limited secondary to patient being aggressive and yelling.       Past Medical History:  Diagnosis Date  . ADD (attention deficit disorder)   . Traumatic brain injury [facility])     Patient Active Problem List   Diagnosis Date Noted  . Alcoholic intoxication without complication [facility])     Past Surgical History:  Procedure Laterality Date  . APPENDECTOMY      Prior to Admission medications   Medication Sig Start Date End Date Taking? Authorizing Provider  cephALEXin (KEFLEX) 500 MG capsule Take 1 capsule (500 mg total) by mouth 3 (three) times daily. 03/03/18   Irean Hong, MD  famotidine (PEPCID) 20 MG tablet Take 1 tablet (20 mg total) by mouth 2 (two) times daily. 10/28/15   Emily Filbert, MD  HYDROcodone-acetaminophen (NORCO) 5-325 MG tablet Take 1 tablet by mouth every 6 (six) hours as needed for moderate pain. 03/03/18   Irean Hong, MD    Allergies Patient has no known allergies.  History reviewed. No pertinent family history.  Social History Social History   Tobacco Use  . Smoking status: Current Every Day Smoker  . Smokeless tobacco: Never Used  Substance Use Topics  . Alcohol use: Yes  . Drug use: Yes    Types: Marijuana    Review of Systems  Constitutional: No fever/chills Eyes: No visual changes. ENT: No sore throat. Cardiovascular: Denies chest pain. Respiratory: Denies shortness of breath. Gastrointestinal: No abdominal pain.   No nausea, no vomiting.  No diarrhea.  No constipation. Genitourinary: Negative for dysuria. Musculoskeletal: Negative for back pain. Skin: Negative for rash. Neurological: Negative for headaches, focal weakness or numbness. Psychiatric:  Positive for aggressive behavior.  ____________________________________________   PHYSICAL EXAM:  VITAL SIGNS: ED Triage Vitals [12/01/18 0115]  Enc Vitals Group     BP      Pulse      Resp      Temp      Temp src      SpO2      Weight 140 lb (63.5 kg)     Height 6\' 5"  (1.956 m)     Head Circumference      Peak Flow      Pain Score      Pain Loc      Pain Edu?      Excl. in GC?     Constitutional: Alert and oriented. Well appearing and in moderate acute distress.  Yelling, pacing, aggressive. Eyes: Conjunctivae are normal. PERRL. EOMI. Head: Atraumatic. Nose: No congestion/rhinnorhea. Mouth/Throat: Mucous membranes are moist.  Oropharynx non-erythematous. Neck: No stridor.   Cardiovascular: Normal rate, regular rhythm. Grossly normal heart sounds.  Good peripheral circulation. Respiratory: Normal respiratory effort.  No retractions. Lungs CTAB. Gastrointestinal: Soft and nontender. No distention. No abdominal bruits. No CVA tenderness. Musculoskeletal: No lower extremity tenderness nor edema.  No joint effusions. Neurologic:  Normal speech and language. No gross focal neurologic deficits are appreciated. No gait instability. Skin:  Skin is warm, dry and intact. No rash noted. Psychiatric: Mood and affect are aggressive. Speech and behavior are normal.  ____________________________________________   LABS (all labs ordered are listed, but only abnormal results are displayed)  Labs Reviewed  COMPREHENSIVE METABOLIC PANEL - Abnormal; Notable for the following components:      Result Value   Potassium 3.4 (*)    Calcium 8.2 (*)    All other components within normal limits  ETHANOL - Abnormal; Notable for the following components:    Alcohol, Ethyl (B) 232 (*)    All other components within normal limits  CBC - Abnormal; Notable for the following components:   WBC 11.3 (*)    All other components within normal limits  ACETAMINOPHEN LEVEL - Abnormal; Notable for the following components:   Acetaminophen (Tylenol), Serum <10 (*)    All other components within normal limits  SALICYLATE LEVEL  URINE DRUG SCREEN, QUALITATIVE ([facility] ONLY)   ____________________________________________  EKG  None ____________________________________________  RADIOLOGY  ED MD interpretation: None  Official radiology report(s): No results found.  ____________________________________________   PROCEDURES  Procedure(s) performed (including Critical Care):  Procedures   ____________________________________________   INITIAL IMPRESSION / ASSESSMENT AND PLAN / ED COURSE  As part of my medical decision making, I reviewed the following data within the [facility] notes reviewed and incorporated, Labs reviewed, Old chart reviewed, A consult was requested and obtained from this/these consultant(s) Psychiatry and Notes from prior ED visits     Anthony Snow was evaluated in Emergency Department on 12/01/2018 for the symptoms described in the history of present illness. He was evaluated in the context of the global COVID-19 pandemic, which necessitated consideration that the patient might be at risk for infection with the SARS-CoV-2 virus that causes COVID-19. Institutional protocols and algorithms that pertain to the evaluation of patients at risk for COVID-19 are in a state of rapid change based on information released by regulatory bodies including the CDC and federal and state organizations. These policies and algorithms were followed during the patient's care in the ED.    33 year old male who presents under IVC, heavily intoxicated and aggressive.  He is unable to be verbally redirected.  Requires IM  calming agents.  Clinical Course as of Dec 01 626  Sat Dec 01, 2018  0233 Patient more cooperative.  Ambulating with steady gait, asking for water.  Redirected back to his bed.   [JS]  [id number] Patient sleeping in no acute distress.  Awaiting psychiatric evaluation and disposition.   [JS]    Clinical Course User Index [JS] Paulette Blanch, MD     ____________________________________________   FINAL CLINICAL IMPRESSION(S) / ED DIAGNOSES  Final diagnoses:  Alcoholic intoxication without complication [facility])     ED Discharge Orders    None       Note:  This document was prepared using Dragon voice recognition software and may include unintentional dictation errors.   Paulette Blanch, MD 12/01/18 [phone]

## 2018-12-01 NOTE — ED Notes (Signed)
Patient transferred via w/c with escort of security and CNA. Patient cooperative.

## 2018-12-01 NOTE — ED Notes (Signed)
Pt belongings added to the rest include: Pair shoes Pair socks Black long sleeved t-shirt

## 2018-12-01 NOTE — ED Notes (Signed)
Patient is calm and cooperative, he used the phone, no signs of distress, or behavioral issues noted, will continue to monitor.
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
## 2018-12-01 NOTE — ED Notes (Signed)
Pt given meal tray.

## 2018-12-01 NOTE — ED Notes (Signed)
Dr. Cinda Quest gave order to move Patient to [place], Nurse reported to Amy RN.

## 2018-12-01 NOTE — ED Triage Notes (Signed)
Pt brought to ED by [facility] under IVC. Pt being loud and uncooperative at this time. Refusing to all this RN to take his vital signs or draw blood.

## 2018-12-01 NOTE — ED Notes (Signed)
Attempted to get blood work and vitals from pt, pt refusing at this time. Will try again.
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
## 2018-12-01 NOTE — Discharge Instructions (Signed)
[facility]) [facility]  Mental health service in [place], [place] Address: [address] [place], [place] [address] Hours:  Closed ? Caesar Bookman Phone: [phone]  AA Resources Handout at discharge

## 2020-04-12 ENCOUNTER — Other Ambulatory Visit: Payer: Self-pay

## 2020-04-12 ENCOUNTER — Emergency Department
Admission: EM | Admit: 2020-04-12 | Discharge: 2020-04-12 | Disposition: A | Discharge status: Home / Self Care | Payer: Medicaid Other | Attending: Emergency Medicine | Admitting: Emergency Medicine

## 2020-04-12 ENCOUNTER — Encounter: Payer: Self-pay | Admitting: Emergency Medicine

## 2020-04-12 DIAGNOSIS — K029 Dental caries, unspecified: Secondary | ICD-10-CM | POA: Insufficient documentation

## 2020-04-12 DIAGNOSIS — F17200 Nicotine dependence, unspecified, uncomplicated: Secondary | ICD-10-CM | POA: Insufficient documentation

## 2020-04-12 MED ORDER — LIDOCAINE VISCOUS HCL 2 % MT SOLN
15.0000 mL | Freq: Once | OROMUCOSAL | Status: AC
  Administered 2020-04-12: 15 mL via OROMUCOSAL
  Filled 2020-04-12: qty 15

## 2020-04-12 MED ORDER — AMOXICILLIN-POT CLAVULANATE 875-125 MG PO TABS
1.0000 | ORAL_TABLET | Freq: Once | ORAL | Status: AC
  Administered 2020-04-12: 1 via ORAL
  Filled 2020-04-12: qty 1

## 2020-04-12 MED ORDER — LIDOCAINE VISCOUS HCL 2 % MT SOLN: 15.0000 mL | OROMUCOSAL | 0 refills | Status: AC | PRN

## 2020-04-12 MED ORDER — AMOXICILLIN-POT CLAVULANATE 875-125 MG PO TABS: 1.0000 | ORAL_TABLET | Freq: Two times a day (BID) | ORAL | 0 refills | Status: AC

## 2020-04-12 MED ORDER — IBUPROFEN 600 MG PO TABS
600.0000 mg | ORAL_TABLET | Freq: Once | ORAL | Status: AC
  Administered 2020-04-12: 600 mg via ORAL
  Filled 2020-04-12: qty 1

## 2020-04-12 MED ORDER — IBUPROFEN 800 MG PO TABS: 800.0000 mg | ORAL_TABLET | Freq: Three times a day (TID) | ORAL | 0 refills | Status: AC | PRN

## 2020-04-12 NOTE — Discharge Instructions (Addendum)
OPTIONS FOR DENTAL FOLLOW UP CARE ° °[place] Department of Health and Human Services - Local Safety Net Dental Clinics °http://www.ncdhhs.gov/dph/oralhealth/services/safetynetclinics.htm °  °[facility] (336-562-3123) ° °[facility] (919-933-9087) ° °[facility] ([phone] ext [phone]) ° °[facility] ([phone]) ° °[facility] ([phone]) °This clinic caters to the indigent population and is on a lottery system. °Location: °[facility], [address], [place] °Clinic Hours: °Wednesdays from 6pm - 9pm, patients seen by a lottery system. °For dates, call or go to www.med.[facility].edu/[facility]ac/patients/Dental-[facility] °Services: °Cleanings, fillings and simple extractions. °Payment Options: °DENTAL WORK IS FREE OF CHARGE. Bring proof of income or support. °Best way to get seen: °Arrive at 5:15 pm - this is a lottery, NOT first come/first serve, so arriving earlier will not increase your chances of being seen. °  °  °[facility] °[phone] °Select option 1 for emergencies °  °Location: °[facility], [address], [place] °Clinic Hours: °No walk-ins accepted - call the day before to schedule an appointment. °Check in times are 9:30 am and 1:30 pm. °Services: °Simple extractions, temporary fillings, pulpectomy/pulp debridement, uncomplicated abscess drainage. °Payment Options: °PAYMENT IS DUE AT THE TIME OF SERVICE.  Fee is usually $100-200, additional surgical procedures (e.g. abscess drainage) may be extra. °Cash, checks, Visa/MasterCard accepted.  Can file Medicaid if patient is covered for dental - patient should call case worker to check. °No discount for [facility] patients. °Best way to get seen: °MUST call the day before and get onto the schedule. Can usually be seen the next 1-2 days. No walk-ins accepted. °  °  °[facility] °[phone] °   °Location: °[facility], [address], [place] °Clinic Hours: °M, W, Th, F 8am or 1:30pm, Tues 9a or 1:30 - first come/first served. °Services: °Simple extractions, temporary fillings, uncomplicated abscess drainage.  You do not need to be an [place] resident. °Payment Options: °PAYMENT IS DUE AT THE TIME OF SERVICE. °Dental insurance, otherwise sliding scale - bring proof of income or support. °Depending on income and treatment needed, cost is usually $50-200. °Best way to get seen: °Arrive early as it is first come/first served. °  °  °[facility] °[phone] °  °Location: °[address] °Clinic Hours: °Mon-Thu 8a-5p °Services: °Most basic dental services including extractions and fillings. °Payment Options: °PAYMENT IS DUE AT THE TIME OF SERVICE. °Sliding scale, up to 50% off - bring proof if income or support. °Medicaid with dental option accepted. °Best way to get seen: °Call to schedule an appointment, can usually be seen within 2 weeks OR they will try to see walk-ins - show up at 8a or 2p (you may have to wait). °  °  °[facility] °919-245-2435 °[place] RESIDENTS ONLY °  °Location: °[facility], [address], [place], [place] [address] °Clinic Hours: By appointment only. °Monday - Thursday 8am-5pm, Friday 8am-12pm °Services: Cleanings, fillings, extractions. °Payment Options: °PAYMENT IS DUE AT THE TIME OF SERVICE. °Cash, Visa or MasterCard. Sliding scale - $30 minimum per service. °Best way to get seen: °Come in to office, complete packet and make an appointment - need proof of income °or support monies for each household member and proof of [place] residence. °Usually takes about a month to get in. °  °  °[facility] °[phone] °  °Location: °[address]   [place] °Clinic Hours: Walk-in Urgent Care Dental Services are offered Monday-Friday  mornings only. °The numbers of emergencies accepted daily is limited to the number of °providers available. °Maximum 15 - Mondays, Wednesdays & Thursdays °Maximum 10 - Tuesdays & Fridays °Services: °You do not need to be a [place] resident to be seen for a dental emergency. °Emergencies are defined as pain, swelling, abnormal bleeding, or dental trauma. Walkins will receive x-rays if needed. °NOTE: Dental cleaning is not an emergency. °Payment Options: °PAYMENT IS DUE AT THE TIME OF SERVICE. °Minimum co-pay is $40.00 for uninsured patients. °Minimum co-pay is $3.00 for Medicaid with dental coverage. °Dental Insurance is accepted and must be presented at time of visit. °Medicare does not cover dental. °Forms of payment: Cash, credit card, checks. °Best way to get seen: °If not previously registered with the clinic, walk-in dental registration begins at 7:15 am and is on a first come/first serve basis. °If previously registered with the clinic, call to make an appointment. °  °  °[facility] °[phone] °[place] RESIDENTS ONLY °  °Location: °[address], [place], [place] °Clinic Hours: °Mon-Thu 10a-2p °Services: Extractions only! °Payment Options: °FREE (donations accepted) - bring proof of income or support °Best way to get seen: °Call and schedule an appointment OR come at 8am on the 1st Monday of every month (except for holidays) when it is first come/first served. °  °  °[facility] °[phone] °  °Location: °[address], [place] °Clinic Hours: °Friday mornings °Services, Payment Options, Best way to get seen: °Call for info °

## 2020-04-12 NOTE — ED Triage Notes (Signed)
Pt to ED via POV with c/o facial swelling to to L side of his face. Pt states known dental carries to L upper jaw. Pt with noted swelling to L cheek. Pt c/o pain to L teeth. Pt states "weird taste" around teeth. Pt A&O x4, NAD noted at this time.

## 2020-04-12 NOTE — ED Provider Notes (Signed)
[facility] Emergency Department Provider Note  ____________________________________________   Event Date/Time   First MD Initiated Contact with Patient 04/12/20 1725     (approximate)  I have reviewed the triage vital signs and the nursing notes.   HISTORY  Chief Complaint Dental Pain  HPI Anthony Snow is a 35 y.o. male who presents to the emergency department for evaluation of left upper tooth pain and facial swelling that began last night.  Patient states he recently had some dental work done in which a root was broken and is going to require additional dental intervention.  He does not recall any recent trauma or injury to the tooth since that time.  Denies fever, chills or other systemic symptoms.  Pain is rated 9/10 and patient has not tried any alleviating measures yet.         Past Medical History:  Diagnosis Date  . ADD (attention deficit disorder)   . Traumatic brain injury [facility])     Patient Active Problem List   Diagnosis Date Noted  . Alcoholic intoxication without complication [facility])     Past Surgical History:  Procedure Laterality Date  . APPENDECTOMY      Prior to Admission medications   Medication Sig Start Date End Date Taking? Authorizing Provider  amoxicillin-clavulanate (AUGMENTIN) 875-125 MG tablet Take 1 tablet by mouth 2 (two) times daily for 7 days. 04/12/20 04/19/20 Yes [facility], Ruben Gottron, PA  ibuprofen (ADVIL) 800 MG tablet Take 1 tablet (800 mg total) by mouth every 8 (eight) hours as needed. 04/12/20  Yes Lucy Chris, PA  lidocaine (XYLOCAINE) 2 % solution Use as directed 15 mLs in the mouth or throat as needed for mouth pain. 04/12/20  Yes Lucy Chris, PA  gabapentin (NEURONTIN) 300 MG capsule Take 1 capsule (300 mg total) by mouth 3 (three) times daily. 12/01/18   Charm Rings, NP    Allergies Patient has no known allergies.  History reviewed. No pertinent family history.  Social  History Social History   Tobacco Use  . Smoking status: Current Every Day Smoker  . Smokeless tobacco: Never Used  Substance Use Topics  . Alcohol use: Yes  . Drug use: Yes    Types: Marijuana    Review of Systems  Constitutional: No fever/chills Eyes: No visual changes. ENT: + Dental pain, + left-sided facial swelling, no sore throat. Cardiovascular: Denies chest pain. Respiratory: Denies shortness of breath. Gastrointestinal: No abdominal pain.  No nausea, no vomiting.  No diarrhea.  No constipation. Genitourinary: Negative for dysuria. Musculoskeletal: Negative for back pain. Skin: Negative for rash. Neurological: Negative for headaches, focal weakness or numbness.  ____________________________________________   PHYSICAL EXAM:  VITAL SIGNS: ED Triage Vitals  Enc Vitals Group     BP 04/12/20 1638 114/72     Pulse Rate 04/12/20 1638 95     Resp 04/12/20 1638 20     Temp 04/12/20 1638 98 F (36.7 C)     Temp Source 04/12/20 1638 Oral     SpO2 04/12/20 1638 98 %     Weight 04/12/20 1636 150 lb (68 kg)     Height 04/12/20 1636 6\' 2"  (1.88 m)     Head Circumference --      Peak Flow --      Pain Score 04/12/20 1636 9     Pain Loc --      Pain Edu? --      Excl. in GC? --  Constitutional: Alert and oriented. Well appearing and in no acute distress. Eyes: Conjunctivae are normal. PERRL. EOMI. Head: Atraumatic. Nose: No congestion/rhinnorhea. Mouth/Throat: Mucous membranes are moist.  Oropharynx non-erythematous.  There is pain to palpation of the left upper dentition, no periapical abscess noted.  There is mild overlying erythema to the left maxillary area, no swelling appreciated.  Nontender to palpation from the external mandible. Neck: No stridor.   Lymphatic: No cervical lymphadenopathy Cardiovascular: Normal rate, regular rhythm. Grossly normal heart sounds.  Good peripheral circulation. Respiratory: Normal respiratory effort.  No retractions. Lungs  CTAB. Neurologic:  Normal speech and language. No gross focal neurologic deficits are appreciated. No gait instability. Skin:  Skin is warm, dry and intact. No rash noted. Psychiatric: Mood and affect are normal. Speech and behavior are normal.   ____________________________________________   INITIAL IMPRESSION / ASSESSMENT AND PLAN / ED COURSE  As part of my medical decision making, I reviewed the following data within the electronic MEDICAL RECORD NUMBER Nursing notes reviewed and incorporated and Notes from prior ED visits        Patient is a 35 year old male who presents to the emergency department for evaluation of dental pain to the left upper jaw that began last night.  Patient had recent dental work completed and reports the dentist broke off the root of an adjacent tooth they were working on.  He states he is supposed to go back to have additional dental work done but has not done this yet.  He denies any new injury that he is aware of recently.  Denies fevers, chills or other systemic symptoms.  In triage, the patient is afebrile, has normal vital signs.  On physical exam there is tenderness to palpation of the left upper dentition but no periapical abscess noted.  Mild overlying erythema without evidence of diffuse facial cellulitis.  Will initiate patient on Augmentin for coverage of possible dental infection as well as treat the pain with ibuprofen and topical viscous lidocaine.  Patient is amenable with this plan appropriate for outpatient follow-up at this time.      ____________________________________________   FINAL CLINICAL IMPRESSION(S) / ED DIAGNOSES  Final diagnoses:  Pain due to dental caries     ED Discharge Orders         Ordered    amoxicillin-clavulanate (AUGMENTIN) 875-125 MG tablet  2 times daily        04/12/20 1740    ibuprofen (ADVIL) 800 MG tablet  Every 8 hours PRN        04/12/20 1740    lidocaine (XYLOCAINE) 2 % solution  As needed        04/12/20  1740          *Please note:  Anthony Snow was evaluated in Emergency Department on 04/12/2020 for the symptoms described in the history of present illness. He was evaluated in the context of the global COVID-19 pandemic, which necessitated consideration that the patient might be at risk for infection with the SARS-CoV-2 virus that causes COVID-19. Institutional protocols and algorithms that pertain to the evaluation of patients at risk for COVID-19 are in a state of rapid change based on information released by regulatory bodies including the CDC and federal and state organizations. These policies and algorithms were followed during the patient's care in the ED.  Some ED evaluations and interventions may be delayed as a result of limited staffing during and the pandemic.*   Note:  This document was prepared using Dragon voice  recognition software and may include unintentional dictation errors.   Lucy Chris, PA 04/12/20 1757    Sharyn Creamer, MD 04/12/20 2104

## 2020-11-06 ENCOUNTER — Emergency Department
Admission: EM | Admit: 2020-11-06 | Discharge: 2020-11-06 | Disposition: A | Discharge status: Home / Self Care | Payer: Self-pay | Attending: Emergency Medicine | Admitting: Emergency Medicine

## 2020-11-06 ENCOUNTER — Emergency Department: Payer: Self-pay

## 2020-11-06 ENCOUNTER — Other Ambulatory Visit: Payer: Self-pay

## 2020-11-06 DIAGNOSIS — R519 Headache, unspecified: Secondary | ICD-10-CM | POA: Insufficient documentation

## 2020-11-06 DIAGNOSIS — F17200 Nicotine dependence, unspecified, uncomplicated: Secondary | ICD-10-CM | POA: Insufficient documentation

## 2020-11-06 DIAGNOSIS — U071 COVID-19: Secondary | ICD-10-CM | POA: Insufficient documentation

## 2020-11-06 LAB — HEPATIC FUNCTION PANEL
ALT: 24 U/L (ref 0–44)
AST: 32 U/L (ref 15–41)
Albumin: 4.4 g/dL (ref 3.5–5.0)
Alkaline Phosphatase: 92 U/L (ref 38–126)
Bilirubin, Direct: <0.1 mg/dL (ref 0.0–0.2)
Total Bilirubin: 0.6 mg/dL (ref 0.3–1.2)
Total Protein: 7.5 g/dL (ref 6.5–8.1)

## 2020-11-06 LAB — CBC
HCT: 38.8 % — ABNORMAL LOW (ref 39.0–52.0)
Hemoglobin: 13.4 g/dL (ref 13.0–17.0)
MCH: 29.2 pg (ref 26.0–34.0)
MCHC: 34.5 g/dL (ref 30.0–36.0)
MCV: 84.5 fL (ref 80.0–100.0)
Platelets: 176 10*3/uL (ref 150–400)
RBC: 4.59 MIL/uL (ref 4.22–5.81)
RDW: 13.1 % (ref 11.5–15.5)
WBC: 5.3 10*3/uL (ref 4.0–10.5)
nRBC: 0 % (ref 0.0–0.2)

## 2020-11-06 LAB — BASIC METABOLIC PANEL
Anion gap: 10 (ref 5–15)
BUN: 7 mg/dL (ref 6–20)
CO2: 25 mmol/L (ref 22–32)
Calcium: 9 mg/dL (ref 8.9–10.3)
Chloride: 101 mmol/L (ref 98–111)
Creatinine, Ser: 0.86 mg/dL (ref 0.61–1.24)
GFR, Estimated: >60 mL/min (ref >60)
Glucose, Bld: 90 mg/dL (ref 70–99)
Potassium: 4.2 mmol/L (ref 3.5–5.1)
Sodium: 136 mmol/L (ref 135–145)

## 2020-11-06 LAB — URINALYSIS, ROUTINE W REFLEX MICROSCOPIC
Bilirubin Urine: NEGATIVE
Glucose, UA: NEGATIVE mg/dL
Hgb urine dipstick: NEGATIVE
Ketones, ur: NEGATIVE mg/dL
Leukocytes,Ua: NEGATIVE
Nitrite: NEGATIVE
Protein, ur: NEGATIVE mg/dL
Specific Gravity, Urine: 1.011 (ref 1.005–1.030)
pH: 8 (ref 5.0–8.0)

## 2020-11-06 LAB — D-DIMER, QUANTITATIVE: D-Dimer, Quant: 0.43 ug/mL-FEU (ref 0.00–0.50)

## 2020-11-06 LAB — RESP PANEL BY RT-PCR (FLU A&B, COVID) ARPGX2
Influenza A by PCR: NEGATIVE
Influenza B by PCR: NEGATIVE
SARS Coronavirus 2 by RT PCR: POSITIVE — AB

## 2020-11-06 LAB — CK: Total CK: 121 U/L (ref 49–397)

## 2020-11-06 LAB — TROPONIN I (HIGH SENSITIVITY): Troponin I (High Sensitivity): <2 ng/L (ref <18)

## 2020-11-06 LAB — LIPASE, BLOOD: Lipase: 21 U/L (ref 11–51)

## 2020-11-06 MED ORDER — ACETAMINOPHEN 500 MG PO TABS
1000.0000 mg | ORAL_TABLET | Freq: Once | ORAL | Status: AC
  Administered 2020-11-06: 1000 mg via ORAL
  Filled 2020-11-06: qty 2

## 2020-11-06 MED ORDER — METOCLOPRAMIDE HCL 5 MG/ML IJ SOLN
10.0000 mg | Freq: Once | INTRAMUSCULAR | Status: AC
  Administered 2020-11-06: 10 mg via INTRAVENOUS
  Filled 2020-11-06: qty 2

## 2020-11-06 MED ORDER — SODIUM CHLORIDE 0.9 % IV BOLUS
1000.0000 mL | Freq: Once | INTRAVENOUS | Status: AC
  Administered 2020-11-06: 1000 mL via INTRAVENOUS

## 2020-11-06 MED ORDER — LORAZEPAM 2 MG/ML IJ SOLN
0.5000 mg | Freq: Once | INTRAMUSCULAR | Status: AC
  Administered 2020-11-06: 0.5 mg via INTRAVENOUS
  Filled 2020-11-06: qty 1

## 2020-11-06 MED ORDER — BENZONATATE 100 MG PO CAPS: 100.0000 mg | ORAL_CAPSULE | Freq: Three times a day (TID) | ORAL | 0 refills | Status: AC | PRN

## 2020-11-06 MED ORDER — DIPHENHYDRAMINE HCL 50 MG/ML IJ SOLN
12.5000 mg | Freq: Once | INTRAMUSCULAR | Status: AC
  Administered 2020-11-06: 12.5 mg via INTRAVENOUS
  Filled 2020-11-06: qty 1

## 2020-11-06 MED ORDER — KETOROLAC TROMETHAMINE 30 MG/ML IJ SOLN
15.0000 mg | Freq: Once | INTRAMUSCULAR | Status: AC
  Administered 2020-11-06: 15 mg via INTRAVENOUS
  Filled 2020-11-06: qty 1

## 2020-11-06 MED ORDER — ONDANSETRON 4 MG PO TBDP: 4.0000 mg | ORAL_TABLET | Freq: Three times a day (TID) | ORAL | 0 refills | Status: AC | PRN

## 2020-11-06 MED ORDER — LIDOCAINE 5 % EX PTCH
1.0000 | MEDICATED_PATCH | CUTANEOUS | Status: DC
  Administered 2020-11-06: 1 via TRANSDERMAL
  Filled 2020-11-06: qty 1

## 2020-11-06 NOTE — ED Triage Notes (Signed)
Pt c/o chest pain with SOB nausea and lower back pain since last night, pt is pale in color on arrival states he has a hx of collapsed lung in the past.
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
## 2020-11-06 NOTE — Discharge Instructions (Addendum)
Take Tylenol 1 g every 8 hours and ibuprofen 600 with food every 6 hours to help with any pain.

## 2020-11-06 NOTE — ED Provider Notes (Signed)
[facility] Emergency Department Provider Note  ____________________________________________   Event Date/Time   First MD Initiated Contact with Patient 11/06/20 [phone]     (approximate)  I have reviewed the triage vital signs and the nursing notes.   HISTORY  Chief Complaint Chest Pain and Shortness of Breath    HPI Anthony Snow is a 35 y.o. male with TBI, ADD who comes in with chest pain with shortness of breath.  Patient reports having symptoms starting last night.  He states that he did use some marijuana Gummies but he states that he uses these daily.  He denies any EtOH use.  Patient states that he is having some pain with breathing and some chest pain as well as some upper back pain that is constant, worse with taking a deep breath, nothing makes it better including some ibuprofen this morning.  He states that he has a history of collapsed lung and is not sure if that was happening again.  Patient states that he is also having a very severe headache.  Reports a history of TBI with bleeding around his brain and he is worried that he could have a brain bleed.           Past Medical History:  Diagnosis Date   ADD (attention deficit disorder)    Traumatic brain injury [facility])     Patient Active Problem List   Diagnosis Date Noted   Alcoholic intoxication without complication (HCC)     Past Surgical History:  Procedure Laterality Date   APPENDECTOMY     SKIN GRAFT      Prior to Admission medications   Medication Sig Start Date End Date Taking? Authorizing Provider  gabapentin (NEURONTIN) 300 MG capsule Take 1 capsule (300 mg total) by mouth 3 (three) times daily. 12/01/18   Charm Rings, NP  ibuprofen (ADVIL) 800 MG tablet Take 1 tablet (800 mg total) by mouth every 8 (eight) hours as needed. 04/12/20   Lucy Chris, PA  lidocaine (XYLOCAINE) 2 % solution Use as directed 15 mLs in the mouth or throat as needed for mouth pain.  04/12/20   Lucy Chris, PA    Allergies Patient has no known allergies.  No family history on file.  Social History Social History   Tobacco Use   Smoking status: Every Day   Smokeless tobacco: Never  Substance Use Topics   Alcohol use: Yes   Drug use: Yes    Types: Marijuana      Review of Systems Constitutional: No fever/chills Eyes: No visual changes. ENT: No sore throat. Cardiovascular: Positive chest pain Respiratory: Positive shortness of breath Gastrointestinal: No abdominal pain.  No nausea, no vomiting.  No diarrhea.  No constipation. Genitourinary: Negative for dysuria. Musculoskeletal: Positive back pain Skin: Negative for rash. Neurological: Positive headache, no focal weakness or numbness. All other ROS negative ____________________________________________   PHYSICAL EXAM:  VITAL SIGNS: ED Triage Vitals  Enc Vitals Group     BP 11/06/20 0847 130/79     Pulse Rate 11/06/20 0847 (!) 107     Resp 11/06/20 0847 20     Temp 11/06/20 0851 98.8 F (37.1 C)     Temp Source 11/06/20 0847 Oral     SpO2 11/06/20 0847 99 %     Weight 11/06/20 0847 139 lb 6.4 oz (63.2 kg)     Height 11/06/20 0847 6' (1.829 m)     Head Circumference --  Peak Flow --      Pain Score 11/06/20 0847 10     Pain Loc --      Pain Edu? --      Excl. in GC? --     Constitutional: Alert and oriented.  Patient appears anxious Eyes: Conjunctivae are normal. EOMI. Head: Atraumatic. Nose: No congestion/rhinnorhea. Mouth/Throat: Mucous membranes are moist.   Neck: No stridor. Trachea Midline. FROM Cardiovascular: Tachycardic, regular rhythm. Grossly normal heart sounds.  Good peripheral circulation. Respiratory: Normal respiratory effort.  No retractions. Lungs CTAB. Gastrointestinal: Soft and nontender. No distention. No abdominal bruits.  Musculoskeletal: No lower extremity tenderness nor edema.  No joint effusions. Neurologic:  Normal speech and language. No gross  focal neurologic deficits are appreciated.  Equal strength in arms and legs. Skin:  Skin is warm, dry and intact. No rash noted. Psychiatric: Mood and affect are normal. Speech and behavior are normal. GU: Deferred   ____________________________________________   LABS (all labs ordered are listed, but only abnormal results are displayed)  Labs Reviewed  RESP PANEL BY RT-PCR (FLU A&B, COVID) ARPGX2 - Abnormal; Notable for the following components:      Result Value   SARS Coronavirus 2 by RT PCR POSITIVE (*)    All other components within normal limits  CBC - Abnormal; Notable for the following components:   HCT 38.8 (*)    All other components within normal limits  URINALYSIS, ROUTINE W REFLEX MICROSCOPIC - Abnormal; Notable for the following components:   Color, Urine YELLOW (*)    APPearance CLEAR (*)    All other components within normal limits  BASIC METABOLIC PANEL  HEPATIC FUNCTION PANEL  LIPASE, BLOOD  D-DIMER, QUANTITATIVE  CK  TROPONIN I (HIGH SENSITIVITY)  TROPONIN I (HIGH SENSITIVITY)   ____________________________________________   ED ECG REPORT I, Concha Se, the attending physician, personally viewed and interpreted this ECG.  Sinus rate of 110, no ST elevation, no T wave inversions, normal intervals ____________________________________________  RADIOLOGY Vela Prose, personally viewed and evaluated these images (plain radiographs) as part of my medical decision making, as well as reviewing the written report by the radiologist.  ED MD interpretation:  no ptx   Official radiology report(s): DG Chest 2 View  Result Date: 11/06/2020 CLINICAL DATA:  Chest pain, shortness of breath EXAM: CHEST - 2 VIEW COMPARISON:  Chest radiograph 05/26/2017 FINDINGS: The cardiomediastinal silhouette is normal. The lungs remain hyperinflated. There is no focal consolidation or pulmonary edema. There is no pleural effusion or pneumothorax. There is no acute osseous  abnormality. IMPRESSION: Unchanged hyperinflated lungs. No radiographic evidence of acute cardiopulmonary process. Electronically Signed   By: Lesia Hausen M.D.   On: 11/06/2020 09:23    ____________________________________________   PROCEDURES  Procedure(s) performed (including Critical Care):  .1-3 Lead EKG Interpretation Performed by: Concha Se, MD Authorized by: Concha Se, MD     Interpretation: abnormal     ECG rate:  100s   ECG rate assessment: tachycardic     Rhythm: sinus tachycardia     Ectopy: none     Conduction: normal     ____________________________________________   INITIAL IMPRESSION / ASSESSMENT AND PLAN / ED COURSE   SHAQUON GROPP was evaluated in Emergency Department on 11/06/2020 for the symptoms described in the history of present illness. He was evaluated in the context of the global COVID-19 pandemic, which necessitated consideration that the patient might be at risk for infection with the SARS-CoV-2 virus  that causes COVID-19. Institutional protocols and algorithms that pertain to the evaluation of patients at risk for COVID-19 are in a state of rapid change based on information released by regulatory bodies including the CDC and federal and state organizations. These policies and algorithms were followed during the patient's care in the ED.    Most Likely DDx:  -Patient comes in with multiple symptoms.  Given patient tachycardic we will get D-dimer to evaluate for PE as well as COVID testing given the headaches.  Neuro exam reassuring and patient just appears very anxious but CT head ordered given concerns for prior brain bleed just to make sure notes of intercranial hemorrhage.  Patient was given migraine cocktail  DDx that was also considered d/t potential to cause harm, but was found less likely based on history and physical (as detailed above): -PNA (no fevers, cough but CXR to evaluate) -PNX (reassured with equal b/l breath sounds, CXR to  evaluate) -Symptomatic anemia (will get H&H) -Aortic Dissection as no tearing pain and no radiation to the mid back, pulses equal -Pericarditis no rub on exam, EKG changes or hx to suggest dx -Tamponade (no notable SOB, tachycardic, hypotensive) -Esophageal rupture (no h/o diffuse vomitting/no crepitus)  Patient feeling very anxious after migraine cocktail.  We will give 0.5 of IV Ativan.  Chest x-ray without evidence of pneumothorax.  UA without evidence of UTI.  No evidence of ACS.  D-dimer was negative unlikely PE.  COVID test was positive which would definitely explain patient's symptoms.  12:38 PM updated patient on the results.  Patient still feeling kind of anxious and I wonder if it is from the Reglan that was given early and as well as some baseline anxiety.  Patient given a low-dose of IV Ativan as well some Toradol to help with the muscle aches but overall his work-up is reassuringly   No other comorbidity and discussed paxlovid but holding off giving otherwise healthy and low risk.  They understand the 10-day quarantine.   I discussed the provisional nature of ED diagnosis, the treatment so far, the ongoing plan of care, follow up appointments and return precautions with the patient and any family or support people present. They expressed understanding and agreed with the plan, discharged home.        ____________________________________________   FINAL CLINICAL IMPRESSION(S) / ED DIAGNOSES   Final diagnoses:  COVID-19     MEDICATIONS GIVEN DURING THIS VISIT:  Medications  lidocaine (LIDODERM) 5 % 1 patch (1 patch Transdermal Patch Applied 11/06/20 0937)  metoCLOPramide (REGLAN) injection 10 mg (10 mg Intravenous Given 11/06/20 0936)  diphenhydrAMINE (BENADRYL) injection 12.5 mg (12.5 mg Intravenous Given 11/06/20 0937)  acetaminophen (TYLENOL) tablet 1,000 mg (1,000 mg Oral Given 11/06/20 0936)  sodium chloride 0.9 % bolus 1,000 mL (0 mLs Intravenous Stopped 11/06/20  1235)  LORazepam (ATIVAN) injection 0.5 mg (0.5 mg Intravenous Given 11/06/20 1002)  ketorolac (TORADOL) 30 MG/ML injection 15 mg (15 mg Intravenous Given 11/06/20 1227)  LORazepam (ATIVAN) injection 0.5 mg (0.5 mg Intravenous Given 11/06/20 1228)     ED Discharge Orders     None        Note:  This document was prepared using Dragon voice recognition software and may include unintentional dictation errors.    Concha Se, MD 11/06/20 [phone]

## 2022-02-20 IMAGING — CT CT HEAD W/O CM
3 series · 16 of 47 positions shown, 19 images · non-contrast
Comparison: None.

CLINICAL DATA: Headache

EXAM:
CT HEAD WITHOUT CONTRAST
TECHNIQUE: Contiguous axial images were obtained from the base of the skull
through the vertex without intravenous contrast.

[Series 2: head wo · axial · 0.42mm/px · z∈[+719,+844]mm · 10 of 31 slices shown, 13 images]
[im 3/31  brain]
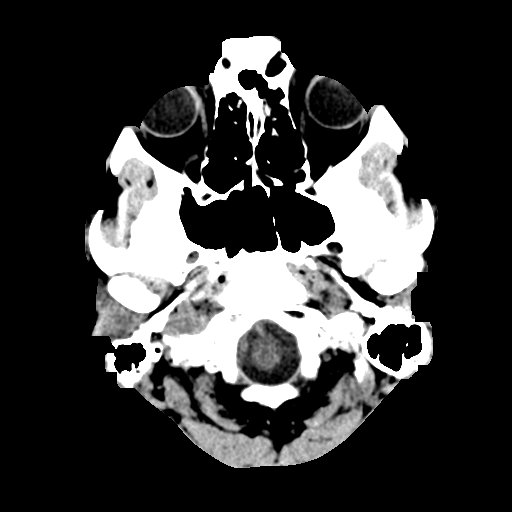
[im 3/31  bone]
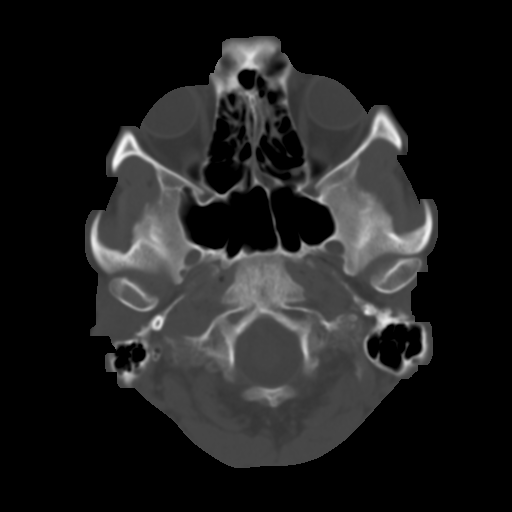
[im 6/31  brain]
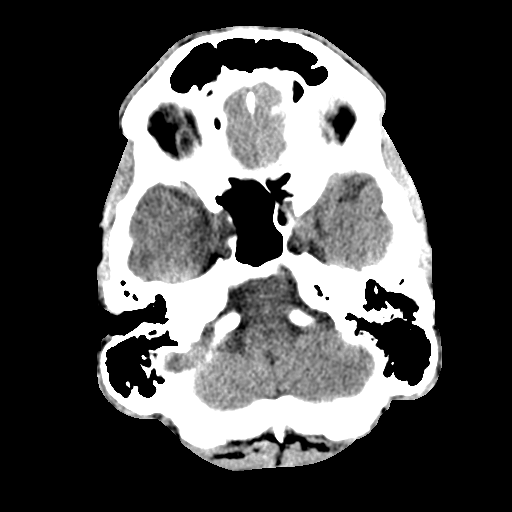
[im 9/31  brain]
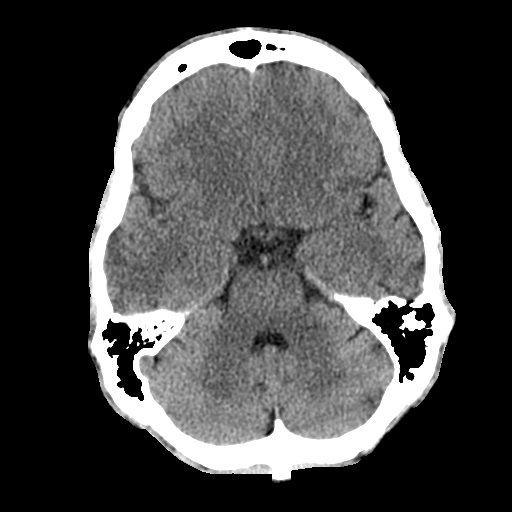
[im 11/31  brain]
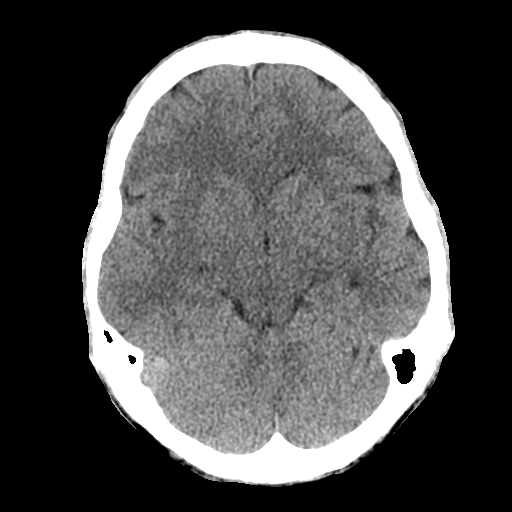
[im 14/31  brain]
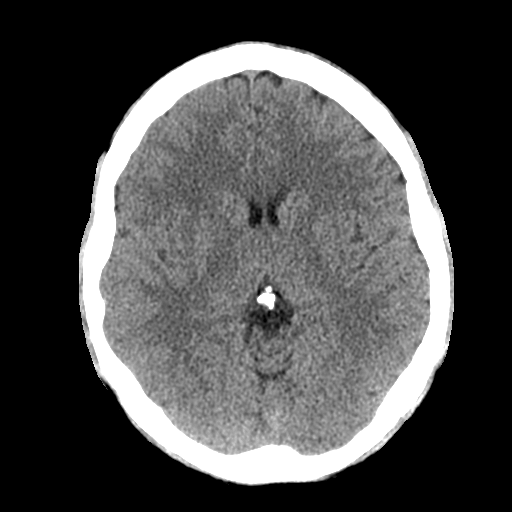
[im 14/31  bone]
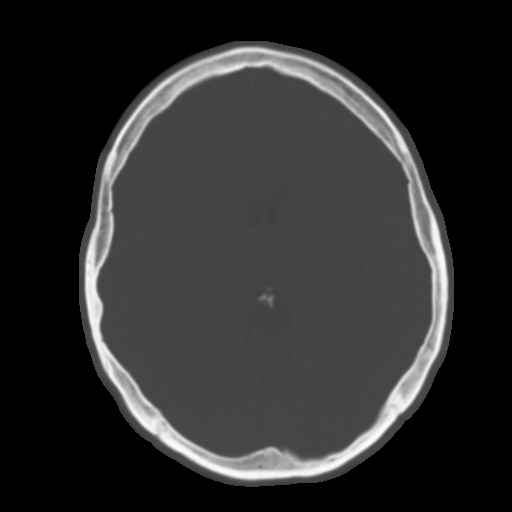
[im 17/31  brain]
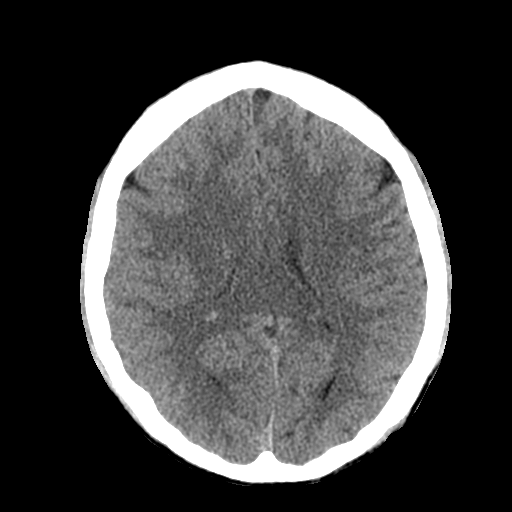
[im 20/31  brain]
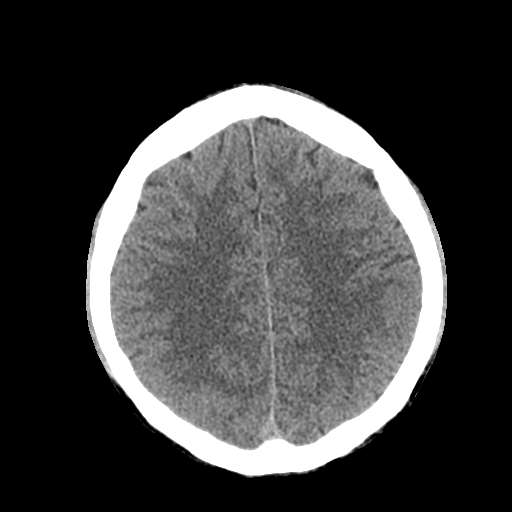
[im 23/31  brain]
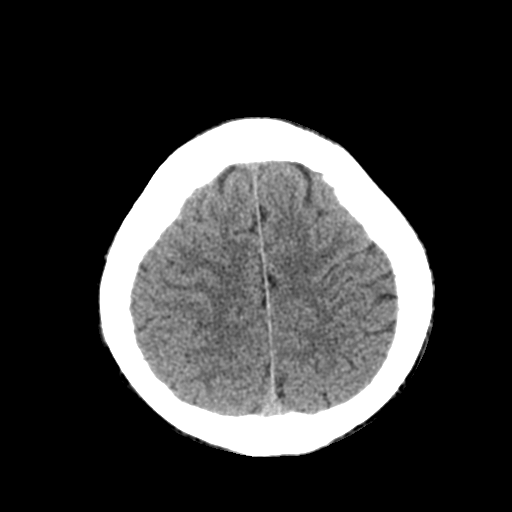
[im 25/31  brain]
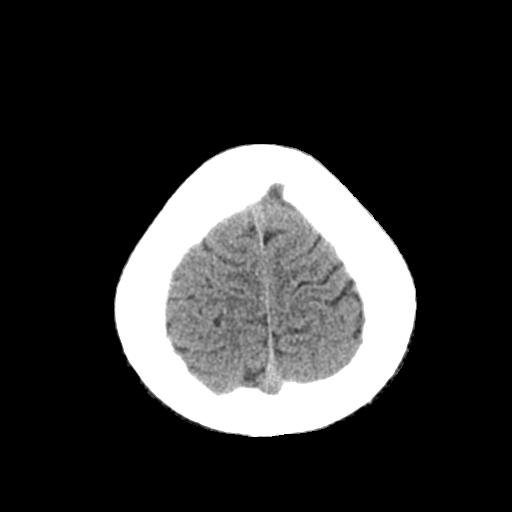
[im 25/31  bone]
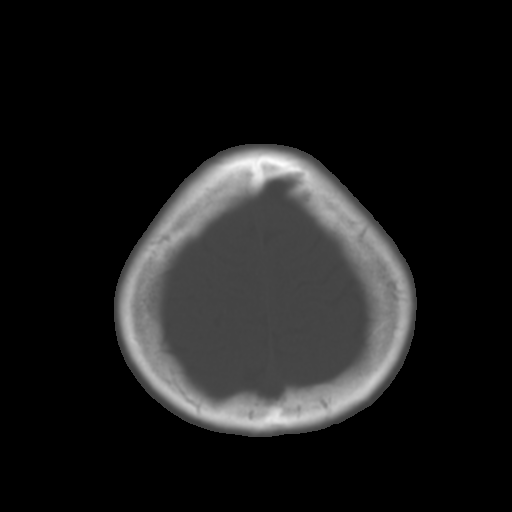
[im 28/31  brain]
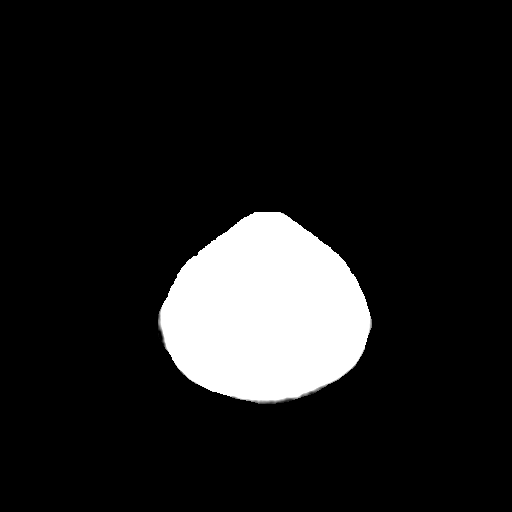

[Series 4: coronal soft tissue · coronal · 0.32mm/px · 3 of 67 slices shown]
[im 23/67  brain]
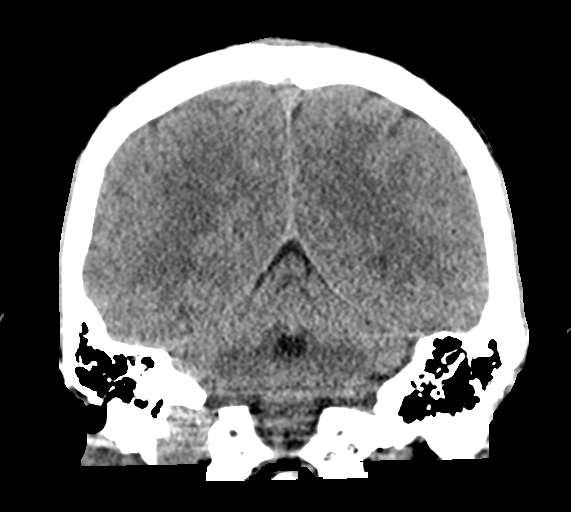
[im 30/67  brain]
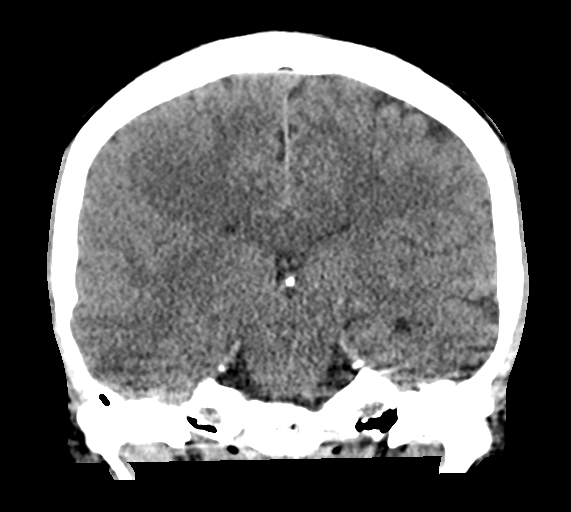
[im 37/67  brain]
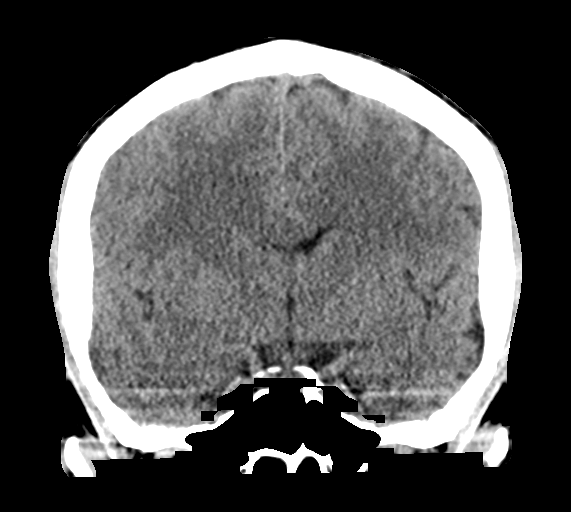

[Series 5: sagittal soft tissue · sagittal · 0.32mm/px · 3 of 61 slices shown]
[im 21/61  brain]
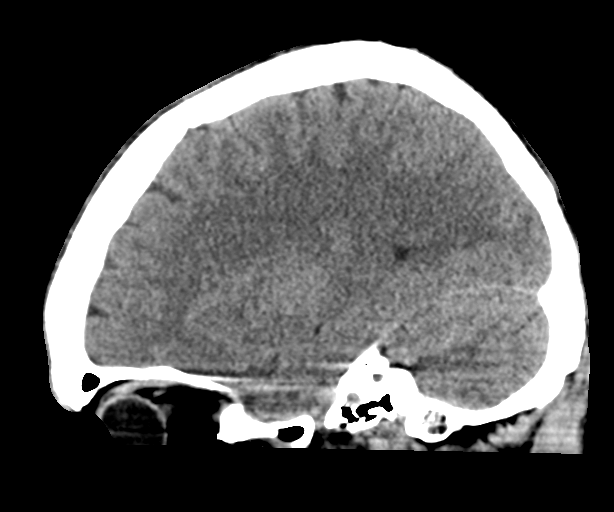
[im 31/61  brain]
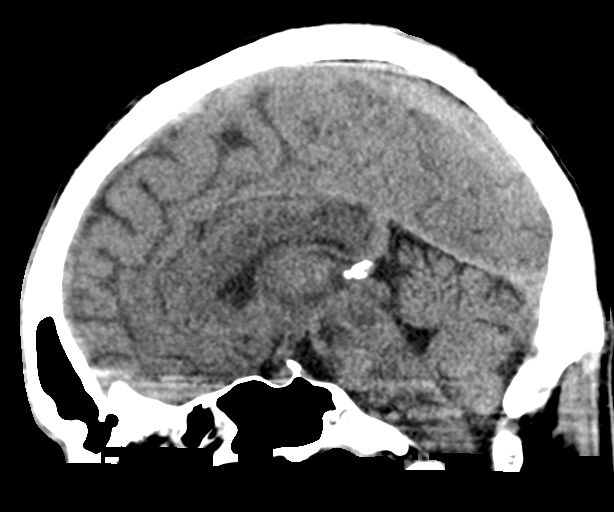
[im 41/61  brain]
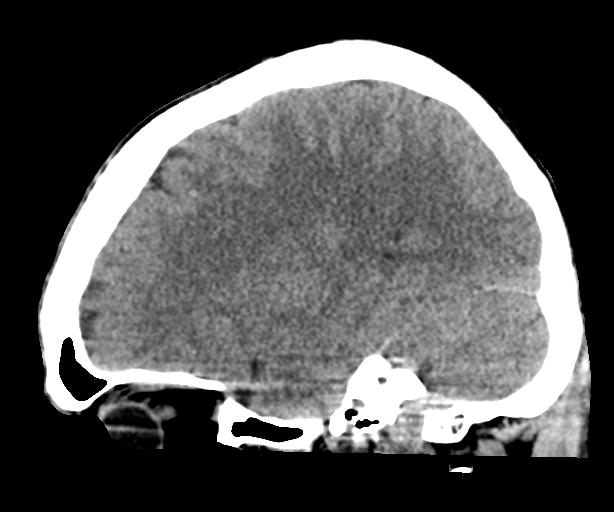

[16 of 47 positions shown; findings below may reference images not displayed]

FINDINGS: Brain: No evidence of acute infarction, hemorrhage, hydrocephalus,
extra-axial collection or mass lesion/mass effect.

Vascular: No hyperdense vessel or unexpected calcification.

Skull: Normal. Negative for fracture or focal lesion.

Sinuses/Orbits: Mucosal thickening of the ethmoid sinuses. No acute
findings.

Other: None.
IMPRESSION: No acute intracranial abnormality.

Mucosal thickening of the ethmoid sinuses, findings can be seen in
setting of sinusitis.

## 2022-11-22 ENCOUNTER — Ambulatory Visit: Payer: Medicaid Other | Admitting: Pediatrics

## 2023-06-08 ENCOUNTER — Emergency Department
Admission: EM | Admit: 2023-06-08 | Discharge: 2023-06-08 | Disposition: A | Discharge status: Home / Self Care | Attending: Emergency Medicine | Admitting: Emergency Medicine

## 2023-06-08 ENCOUNTER — Emergency Department

## 2023-06-08 ENCOUNTER — Other Ambulatory Visit: Payer: Self-pay

## 2023-06-08 DIAGNOSIS — R569 Unspecified convulsions: Secondary | ICD-10-CM | POA: Diagnosis present

## 2023-06-08 DIAGNOSIS — D649 Anemia, unspecified: Secondary | ICD-10-CM | POA: Diagnosis not present

## 2023-06-08 LAB — CBC
HCT: 28.9 % — ABNORMAL LOW (ref 39.0–52.0)
Hemoglobin: 8.7 g/dL — ABNORMAL LOW (ref 13.0–17.0)
MCH: 18.4 pg — ABNORMAL LOW (ref 26.0–34.0)
MCHC: 30.1 g/dL (ref 30.0–36.0)
MCV: 61 fL — ABNORMAL LOW (ref 80.0–100.0)
Platelets: 387 10*3/uL (ref 150–400)
RBC: 4.74 MIL/uL (ref 4.22–5.81)
RDW: 19.2 % — ABNORMAL HIGH (ref 11.5–15.5)
WBC: 6.3 10*3/uL (ref 4.0–10.5)
nRBC: 0 % (ref 0.0–0.2)

## 2023-06-08 LAB — COMPREHENSIVE METABOLIC PANEL WITH GFR
ALT: 19 U/L (ref 0–44)
AST: 26 U/L (ref 15–41)
Albumin: 4.4 g/dL (ref 3.5–5.0)
Alkaline Phosphatase: 79 U/L (ref 38–126)
Anion gap: 14 (ref 5–15)
BUN: 9 mg/dL (ref 6–20)
CO2: 22 mmol/L (ref 22–32)
Calcium: 9.1 mg/dL (ref 8.9–10.3)
Chloride: 99 mmol/L (ref 98–111)
Creatinine, Ser: 1 mg/dL (ref 0.61–1.24)
GFR, Estimated: >60 mL/min (ref >60)
Glucose, Bld: 99 mg/dL (ref 70–99)
Potassium: 4 mmol/L (ref 3.5–5.1)
Sodium: 135 mmol/L (ref 135–145)
Total Bilirubin: 0.5 mg/dL (ref 0.0–1.2)
Total Protein: 8.2 g/dL — ABNORMAL HIGH (ref 6.5–8.1)

## 2023-06-08 MED ORDER — FERROUS SULFATE 325 (65 FE) MG PO TBEC: 325.0000 mg | DELAYED_RELEASE_TABLET | Freq: Two times a day (BID) | ORAL | 3 refills | Status: AC

## 2023-06-08 MED ORDER — TRAZODONE HCL 100 MG PO TABS: 100.0000 mg | ORAL_TABLET | Freq: Every day | ORAL | 1 refills | Status: AC

## 2023-06-08 MED ORDER — SODIUM CHLORIDE 0.9 % IV BOLUS
1000.0000 mL | Freq: Once | INTRAVENOUS | Status: AC
  Administered 2023-06-08: 1000 mL via INTRAVENOUS

## 2023-06-08 NOTE — ED Provider Notes (Signed)
 [facility] Provider Note    Event Date/Time   First MD Initiated Contact with Patient 06/08/23 1847     (approximate)  History   Chief Complaint: Seizures  HPI  Anthony Snow is a 38 y.o. male with a past medical history of traumatic brain injury, ADD, presents to the emergency department for possible seizure.  According to the patient's mother she heard the patient fall, went and found him on the ground with tonic-clonic/generalized seizure activity.  Patient states approximately 1 year ago he had a similar event, and thinks he has had a total of 3 or 4 seizures over the last few years.  Patient does admit to using kratom on a daily basis.  Currently patient is awake alert oriented, no distress with no complaints.  Physical Exam   Triage Vital Signs: ED Triage Vitals  Encounter Vitals Group     BP 06/08/23 1833 135/77     Systolic BP Percentile --      Diastolic BP Percentile --      Pulse Rate 06/08/23 1833 (!) 108     Resp 06/08/23 1833 18     Temp 06/08/23 1833 97.8 F (36.6 C)     Temp Source 06/08/23 1833 Oral     SpO2 06/08/23 1833 100 %     Weight 06/08/23 1832 152 lb 6.4 oz (69.1 kg)     Height 06/08/23 1832 6' (1.829 m)     Head Circumference --      Peak Flow --      Pain Score 06/08/23 1834 0     Pain Loc --      Pain Education --      Exclude from Growth Chart --     Most recent vital signs: Vitals:   06/08/23 2000 06/08/23 2030  BP: 128/78 (!) 122/59  Pulse: 90 90  Resp: 18 19  Temp:    SpO2: 100% 100%    General: Awake, no distress.  CV:  Good peripheral perfusion.  Regular rate and rhythm  Resp:  Normal effort.  Equal breath sounds bilaterally.  Abd:  No distention.  Soft, nontender.  No rebound or guarding.  ED Results / Procedures / Treatments   EKG  EKG viewed and interpreted by myself shows sinus tachycardia 108 bpm with a narrow QRS, normal axis, normal intervals, no concerning ST changes.  RADIOLOGY  I  have reviewed interpreted CT head images.  No bleed or mass seen on my evaluation.   MEDICATIONS ORDERED IN ED: Medications  sodium chloride  0.9 % bolus 1,000 mL (1,000 mLs Intravenous New Bag/Given 06/08/23 1924)     IMPRESSION / MDM / ASSESSMENT AND PLAN / ED COURSE  I reviewed the triage vital signs and the nursing notes.  Patient's presentation is most consistent with acute presentation with potential threat to life or bodily function.  Patient presents emergency department for possible seizure tonight.  Patient's lab work today shows anemia with a hemoglobin of 8.7 and this severely low MCV possibly indicating more iron deficient anemia.  Patient denies any black or bloody stool.  Patient's chemistry is reassuring aside some slight signs of dehydration with anion gap of 14.  Will obtain a CT scan of the head as a precaution given I do not see any recent CT imaging performed.  I researched kratom, which appears to have a seizure risk and could very likely be the cause of the patient's more recent seizure activity.  Patient also has  a history of TBI, very well could be epileptic but has not been worked up by neurology.  Regardless of today's workup I believe the patient will require outpatient neurology follow-up for further testing and EEG.  I discussed the patient's anemia with the patient mother.  Discussed that we need to do a rectal examination to check the stool for any blood.  Patient states has not had any black or bloody stool does not want rectal examination states he will follow-up with his doctor.  Patient's MCV is 61 we will prescribe the patient iron and have him follow-up with his doctor for recheck of his blood counts in 2 weeks.  Chemistry is reassuring and CT scan of the head is resulted reassuring as well.  I will write the patient for a sleep medication, he states every time he has tried to stop kratom he has to restart because he could not sleep.  Patient is going to stop  kratom and hopefully the sleep aid will help the patient get some rest while he is doing so.  I have also referred to neurology for further workup of possible epileptic condition.  Patient states he is ready to go home.  FINAL CLINICAL IMPRESSION(S) / ED DIAGNOSES   Seizure Anemia  Note:  This document was prepared using Dragon voice recognition software and may include unintentional dictation errors.   Ruth Cove, MD 06/08/23 2238

## 2023-06-08 NOTE — Discharge Instructions (Addendum)
 As we discussed please discontinue use of kratom.  You may use your prescribed sleep medication if needed for insomnia.  Also as we discussed your blood counts are low today.  Please begin taking your iron supplements as prescribed and follow-up with your doctor or call the number provided for hematologist to arrange a follow-up appointment in approximately 1 to 2 weeks to have your blood levels redrawn.  Please also call the number provided for neurology to arrange a follow-up appointment for further evaluation of your seizure episodes.  Return to the emergency department for any further seizure activity or any other symptom personally concerning to yourself.

## 2023-06-08 NOTE — ED Triage Notes (Signed)
 EMS states patient had witnessed seizure activity by mother lasting 3 minutes, TBI in 2012. Patient admits to taking Kratom, 100mg  of Benadryl  and 3 Nicotine patches earlier today.   CBG 135 HR 139 O2 100% BP 129/79 ST

## 2023-06-13 ENCOUNTER — Telehealth: Payer: Self-pay | Admitting: *Deleted

## 2023-06-13 NOTE — Telephone Encounter (Signed)
 The mother called yesterday and I believe that she gave me the wrong date of birth, I called  back to the mother but no answer. I spoke to [facility] about this because there is not referral. But this on check out says get appt with dr. Wilhelmenia Harada. Dr. Wilhelmenia Harada was never spoke to about the patient. [facility] -referral intake has been trying to get referral and she will call the mother or the son today. On the phone call the DOB 03-Jul-1985 but it is really 07-Oct-1985. I did call the mother and left message that the referral person will call and get appt set up

## 2023-06-20 ENCOUNTER — Encounter: Payer: Self-pay | Admitting: Emergency Medicine
